# Patient Record
Sex: Male | Born: 1950
Health system: Southern US, Community
[De-identification: ages and names within clinical notes are randomized; demographics above are authoritative.]

## PROBLEM LIST (undated history)

## (undated) DIAGNOSIS — E7800 Pure hypercholesterolemia, unspecified: Secondary | ICD-10-CM

## (undated) DIAGNOSIS — H269 Unspecified cataract: Secondary | ICD-10-CM

## (undated) DIAGNOSIS — I10 Essential (primary) hypertension: Secondary | ICD-10-CM

## (undated) DIAGNOSIS — E079 Disorder of thyroid, unspecified: Secondary | ICD-10-CM

## (undated) DIAGNOSIS — T7840XA Allergy, unspecified, initial encounter: Secondary | ICD-10-CM

## (undated) DIAGNOSIS — D126 Benign neoplasm of colon, unspecified: Secondary | ICD-10-CM

## (undated) DIAGNOSIS — K219 Gastro-esophageal reflux disease without esophagitis: Secondary | ICD-10-CM

## (undated) DIAGNOSIS — R011 Cardiac murmur, unspecified: Secondary | ICD-10-CM

## (undated) DIAGNOSIS — M1990 Unspecified osteoarthritis, unspecified site: Secondary | ICD-10-CM

## (undated) DIAGNOSIS — G4730 Sleep apnea, unspecified: Secondary | ICD-10-CM

## (undated) HISTORY — DX: Essential (primary) hypertension: I10

## (undated) HISTORY — DX: Disorder of thyroid, unspecified: E07.9

## (undated) HISTORY — DX: Allergy, unspecified, initial encounter: T78.40XA

## (undated) HISTORY — PX: COLONOSCOPY: SHX174

## (undated) HISTORY — DX: Unspecified cataract: H26.9

## (undated) HISTORY — PX: TONSILLECTOMY: SUR1361

## (undated) HISTORY — DX: Cardiac murmur, unspecified: R01.1

## (undated) HISTORY — DX: Unspecified osteoarthritis, unspecified site: M19.90

---

## 2016-02-26 ENCOUNTER — Telehealth: Payer: Self-pay

## 2016-02-26 NOTE — Telephone Encounter (Signed)
[phone]  Patient received letter to schedule tcs

## 2016-03-09 ENCOUNTER — Telehealth: Payer: Self-pay

## 2016-03-09 NOTE — Telephone Encounter (Signed)
See separate triage.  

## 2016-03-18 DIAGNOSIS — D126 Benign neoplasm of colon, unspecified: Secondary | ICD-10-CM

## 2016-03-18 HISTORY — DX: Benign neoplasm of colon, unspecified: D12.6

## 2016-03-22 ENCOUNTER — Other Ambulatory Visit: Payer: Self-pay

## 2016-03-22 DIAGNOSIS — Z1211 Encounter for screening for malignant neoplasm of colon: Secondary | ICD-10-CM

## 2016-03-22 MED ORDER — PEG 3350-KCL-NA BICARB-NACL 420 G PO SOLR: 4000.0000 mL | ORAL | 0 refills | Status: DC

## 2016-03-22 NOTE — Telephone Encounter (Signed)
To [facility]inger to mail instructions.

## 2016-03-22 NOTE — Telephone Encounter (Signed)
NO PA is needed 

## 2016-03-22 NOTE — Telephone Encounter (Signed)
TRILYTE SPLIT DOSING, CLEAR LIQUIDS WITH BREAKFAST.

## 2016-03-22 NOTE — Telephone Encounter (Signed)
Rx called into [facility] and instructions are in the mail.

## 2016-03-22 NOTE — Telephone Encounter (Signed)
To Ginger

## 2016-03-22 NOTE — Telephone Encounter (Signed)
Gastroenterology Pre-Procedure Review  Request Date: 03/09/2016 Requesting Physician: Dr. Anastasio Champion  PATIENT REVIEW QUESTIONS: The patient responded to the following health history questions as indicated:    1. Diabetes Melitis: no 2. Joint replacements in the past 12 months: no 3. Major health problems in the past 3 months: no 4. Has an artificial valve or MVP: no 5. Has a defibrillator: no 6. Has been advised in past to take antibiotics in advance of a procedure like teeth cleaning: no 7. Family history of colon cancer: no  8. Alcohol Use: one beer daily 9. History of sleep apnea: Yes/ has C-pap 10. History of coronary artery or other vascular stents placed within the last 12 months: no    MEDICATIONS & ALLERGIES:    Patient reports the following regarding taking any blood thinners:   Plavix? no Aspirin? no Coumadin? no Brilinta? no Xarelto? no Eliquis? no Pradaxa? no Savaysa? no Effient? no  Patient confirms/reports the following medications:  Current Outpatient Prescriptions  Medication Sig Dispense Refill  . atorvastatin (LIPITOR) 40 MG tablet Take 40 mg by mouth daily.    . ranitidine (ZANTAC) 75 MG tablet Take 75 mg by mouth 2 (two) times daily.     No current facility-administered medications for this visit.     Patient confirms/reports the following allergies:  No Known Allergies  No orders of the defined types were placed in this encounter.   AUTHORIZATION INFORMATION Primary Insurance:  ID #:  Group #:  Pre-Cert / Auth required:  Pre-Cert / Auth #:   Secondary Insurance:   ID #:   Group #:  Pre-Cert / Auth required: Pre-Cert / Auth #:   SCHEDULE INFORMATION: Procedure has been scheduled as follows:  Date: 04/05/2016                Time: 8:30 AM Location: [facility] Short Stay  This Gastroenterology Pre-Precedure Review Form is being routed to the following provider(s): Barney Drain, MD

## 2016-04-05 ENCOUNTER — Encounter (HOSPITAL_COMMUNITY)
Admission: RE | Disposition: A | Discharge status: Home / Self Care | Payer: Self-pay | Source: Ambulatory Visit | Attending: Gastroenterology

## 2016-04-05 ENCOUNTER — Ambulatory Visit (HOSPITAL_COMMUNITY)
Admission: RE | Admit: 2016-04-05 | Discharge: 2016-04-05 | Disposition: A | Discharge status: Home / Self Care | Payer: Managed Care, Other (non HMO) | Source: Ambulatory Visit | Attending: Gastroenterology | Admitting: Gastroenterology

## 2016-04-05 ENCOUNTER — Encounter (HOSPITAL_COMMUNITY): Payer: Self-pay | Admitting: *Deleted

## 2016-04-05 DIAGNOSIS — Z87891 Personal history of nicotine dependence: Secondary | ICD-10-CM | POA: Diagnosis not present

## 2016-04-05 DIAGNOSIS — Z86010 Personal history of colonic polyps: Secondary | ICD-10-CM | POA: Insufficient documentation

## 2016-04-05 DIAGNOSIS — Z1211 Encounter for screening for malignant neoplasm of colon: Secondary | ICD-10-CM

## 2016-04-05 DIAGNOSIS — D122 Benign neoplasm of ascending colon: Secondary | ICD-10-CM | POA: Insufficient documentation

## 2016-04-05 DIAGNOSIS — D123 Benign neoplasm of transverse colon: Secondary | ICD-10-CM | POA: Diagnosis not present

## 2016-04-05 DIAGNOSIS — Z79899 Other long term (current) drug therapy: Secondary | ICD-10-CM | POA: Insufficient documentation

## 2016-04-05 DIAGNOSIS — G4730 Sleep apnea, unspecified: Secondary | ICD-10-CM | POA: Diagnosis not present

## 2016-04-05 DIAGNOSIS — K219 Gastro-esophageal reflux disease without esophagitis: Secondary | ICD-10-CM | POA: Insufficient documentation

## 2016-04-05 DIAGNOSIS — E7800 Pure hypercholesterolemia, unspecified: Secondary | ICD-10-CM | POA: Diagnosis not present

## 2016-04-05 DIAGNOSIS — K644 Residual hemorrhoidal skin tags: Secondary | ICD-10-CM | POA: Insufficient documentation

## 2016-04-05 DIAGNOSIS — Q438 Other specified congenital malformations of intestine: Secondary | ICD-10-CM | POA: Insufficient documentation

## 2016-04-05 DIAGNOSIS — K648 Other hemorrhoids: Secondary | ICD-10-CM | POA: Diagnosis not present

## 2016-04-05 HISTORY — DX: Pure hypercholesterolemia, unspecified: E78.00

## 2016-04-05 HISTORY — DX: Benign neoplasm of colon, unspecified: D12.6

## 2016-04-05 HISTORY — DX: Gastro-esophageal reflux disease without esophagitis: K21.9

## 2016-04-05 HISTORY — DX: Sleep apnea, unspecified: G47.30

## 2016-04-05 HISTORY — PX: COLONOSCOPY: SHX5424

## 2016-04-05 SURGERY — COLONOSCOPY
Anesthesia: Moderate Sedation

## 2016-04-05 MED ORDER — MIDAZOLAM HCL 5 MG/5ML IJ SOLN
INTRAMUSCULAR | Status: AC
  Filled 2016-04-05: qty 10

## 2016-04-05 MED ORDER — MEPERIDINE HCL 100 MG/ML IJ SOLN
INTRAMUSCULAR | Status: AC
  Filled 2016-04-05: qty 2

## 2016-04-05 MED ORDER — MIDAZOLAM HCL 5 MG/5ML IJ SOLN
INTRAMUSCULAR | Status: DC | PRN
  Administered 2016-04-05 (×2): 2 mg via INTRAVENOUS

## 2016-04-05 MED ORDER — STERILE WATER FOR IRRIGATION IR SOLN
Status: DC | PRN
  Administered 2016-04-05: 09:00:00

## 2016-04-05 MED ORDER — SODIUM CHLORIDE 0.9 % IV SOLN
INTRAVENOUS | Status: DC
  Administered 2016-04-05: 08:00:00 via INTRAVENOUS

## 2016-04-05 MED ORDER — MEPERIDINE HCL 100 MG/ML IJ SOLN
INTRAMUSCULAR | Status: DC | PRN
  Administered 2016-04-05: 25 mg
  Administered 2016-04-05: 50 mg

## 2016-04-05 NOTE — Discharge Instructions (Addendum)
Colon Polyps Polyps are tissue growths inside the body. Polyps can grow in many places, including the large intestine (colon). A polyp may be a round bump or a mushroom-shaped growth. You could have one polyp or several. Most colon polyps are noncancerous (benign). However, some colon polyps can become cancerous over time. What are the causes? The exact cause of colon polyps is not known. What increases the risk? This condition is more likely to develop in people who:  Have a family history of colon cancer or colon polyps.  Are older than 59 or older than 45 if they are African American.  Have inflammatory bowel disease, such as ulcerative colitis or Crohn disease.  Are overweight.  Smoke cigarettes.  Do not get enough exercise.  Drink too much alcohol.  Eat a diet that is:  High in fat and red meat.  Low in fiber.  Had childhood cancer that was treated with abdominal radiation. What are the signs or symptoms? Most polyps do not cause symptoms. If you have symptoms, they may include:  Blood coming from your rectum when having a bowel movement.  Blood in your stool.The stool may look dark red or black.  A change in bowel habits, such as constipation or diarrhea. How is this diagnosed? This condition is diagnosed with a colonoscopy. This is a procedure that uses a lighted, flexible scope to look at the inside of your colon. How is this treated? Treatment for this condition involves removing any polyps that are found. Those polyps will then be tested for cancer. If cancer is found, your health care provider will talk to you about options for colon cancer treatment. Follow these instructions at home: Diet   Eat plenty of fiber, such as fruits, vegetables, and whole grains.  Eat foods that are high in calcium and vitamin D, such as milk, cheese, yogurt, eggs, liver, fish, and broccoli.  Limit foods high in fat, red meats, and processed meats, such as hot dogs, sausage,  bacon, and lunch meats.  Maintain a healthy weight, or lose weight if recommended by your health care provider. General instructions   Do not smoke cigarettes.  Do not drink alcohol excessively.  Keep all follow-up visits as told by your health care provider. This is important. This includes keeping regularly scheduled colonoscopies. Talk to your health care provider about when you need a colonoscopy.  Exercise every day or as told by your health care provider. Contact a health care provider if:  You have new or worsening bleeding during a bowel movement.  You have new or increased blood in your stool.  You have a change in bowel habits.  You unexpectedly lose weight. This information is not intended to replace advice given to you by your health care provider. Make sure you discuss any questions you have with your health care provider. Document Released: 10/01/2003 Document Revised: 06/12/2015 Document Reviewed: 11/25/2014 Elsevier Interactive Patient Education  2017 [place]. High-Fiber Diet Fiber, also called dietary fiber, is a type of carbohydrate found in fruits, vegetables, whole grains, and beans. A high-fiber diet can have many health benefits. Your health care provider may recommend a high-fiber diet to help:  Prevent constipation. Fiber can make your bowel movements more regular.  Lower your cholesterol.  Relieve hemorrhoids, uncomplicated diverticulosis, or irritable bowel syndrome.  Prevent overeating as part of a weight-loss plan.  Prevent heart disease, type 2 diabetes, and certain cancers. What is my plan? The recommended daily intake of fiber includes:  38  grams for men under age 31.  51 grams for men over age 32.  63 grams for women under age 42.  32 grams for women over age 20. You can get the recommended daily intake of dietary fiber by eating a variety of fruits, vegetables, grains, and beans. Your health care provider may also recommend a fiber  supplement if it is not possible to get enough fiber through your diet. What do I need to know about a high-fiber diet?  Fiber supplements have not been widely studied for their effectiveness, so it is better to get fiber through food sources.  Always check the fiber content on thenutrition facts label of any prepackaged food. Look for foods that contain at least 5 grams of fiber per serving.  Ask your dietitian if you have questions about specific foods that are related to your condition, especially if those foods are not listed in the following section.  Increase your daily fiber consumption gradually. Increasing your intake of dietary fiber too quickly may cause bloating, cramping, or gas.  Drink plenty of water. Water helps you to digest fiber. What foods can I eat? Grains  Whole-grain breads. Multigrain cereal. Oats and oatmeal. Brown rice. Barley. Bulgur wheat. [place]. Bran muffins. Popcorn. Rye wafer crackers. Vegetables  Sweet potatoes. Spinach. Kale. Artichokes. Cabbage. Broccoli. Green peas. Carrots. Squash. Fruits  Berries. Pears. Apples. Oranges. Avocados. Prunes and raisins. Dried figs. Meats and Other Protein Sources  [place], kidney, pinto, and soy beans. Split peas. Lentils. Nuts and seeds. Dairy  Fiber-fortified yogurt. Beverages  Fiber-fortified soy milk. Fiber-fortified orange juice. Other  Fiber bars. The items listed above may not be a complete list of recommended foods or beverages. Contact your dietitian for more options.  What foods are not recommended? Grains  White bread. Pasta made with refined flour. White rice. Vegetables  Fried potatoes. Canned vegetables. Well-cooked vegetables. Fruits  Fruit juice. Cooked, strained fruit. Meats and Other Protein Sources  Fatty cuts of meat. Fried Sales executive or fried fish. Dairy  Milk. Yogurt. Cream cheese. Sour cream. Beverages  Soft drinks. Other  Cakes and pastries. Butter and oils. The items listed above may  not be a complete list of foods and beverages to avoid. Contact your dietitian for more information.  What are some tips for including high-fiber foods in my diet?  Eat a wide variety of high-fiber foods.  Make sure that half of all grains consumed each day are whole grains.  Replace breads and cereals made from refined flour or white flour with whole-grain breads and cereals.  Replace white rice with brown rice, bulgur wheat, or millet.  Start the day with a breakfast that is high in fiber, such as a cereal that contains at least 5 grams of fiber per serving.  Use beans in place of meat in soups, salads, or pasta.  Eat high-fiber snacks, such as berries, raw vegetables, nuts, or popcorn. This information is not intended to replace advice given to you by your health care provider. Make sure you discuss any questions you have with your health care provider. Document Released: 01/04/2005 Document Revised: 06/12/2015 Document Reviewed: 06/19/2013 Elsevier Interactive Patient Education  2017 [place]. Colonoscopy, Adult, Care After This sheet gives you information about how to care for yourself after your procedure. Your health care provider may also give you more specific instructions. If you have problems or questions, contact your health care provider. What can I expect after the procedure? After the procedure, it is common to have:  A small amount of blood in your stool for 24 hours after the procedure.  Some gas.  Mild abdominal cramping or bloating. Follow these instructions at home: General instructions    For the first 24 hours after the procedure:  Do not drive or use machinery.  Do not sign important documents.  Do not drink alcohol.  Do your regular daily activities at a slower pace than normal.  Eat soft, easy-to-digest foods.  Rest often.  Take over-the-counter or prescription medicines only as told by your health care provider.  It is up to you to get  the results of your procedure. Ask your health care provider, or the department performing the procedure, when your results will be ready. Relieving cramping and bloating   Try walking around when you have cramps or feel bloated.  Apply heat to your abdomen as told by your health care provider. Use a heat source that your health care provider recommends, such as a moist heat pack or a heating pad.  Place a towel between your skin and the heat source.  Leave the heat on for 20-30 minutes.  Remove the heat if your skin turns bright red. This is especially important if you are unable to feel pain, heat, or cold. You may have a greater risk of getting burned. Eating and drinking   Drink enough fluid to keep your urine clear or pale yellow.  Resume your normal diet as instructed by your health care provider. Avoid heavy or fried foods that are hard to digest.  Avoid drinking alcohol for as long as instructed by your health care provider. Contact a health care provider if:  You have blood in your stool 2-3 days after the procedure. Get help right away if:  You have more than a small spotting of blood in your stool.  You pass large blood clots in your stool.  Your abdomen is swollen.  You have nausea or vomiting.  You have a fever.  You have increasing abdominal pain that is not relieved with medicine. This information is not intended to replace advice given to you by your health care provider. Make sure you discuss any questions you have with your health care provider. Document Released: 08/19/2003 Document Revised: 09/29/2015 Document Reviewed: 03/18/2015 Elsevier Interactive Patient Education  2017 [place] have [place]. YOU HAD CINQUO Plipos en el colon REMOTO.   FOLLOW A Dieta con alto contenido de [place]. AVOID ITEMS THAT CAUSE BLOATING. SEE INFO BELOW.  YOUR BIOPSY RESULTS WILL BE AVAILABLE IN MY CHART AFTER  MAR 22 AND MY OFFICE WILL CONTACT YOU IN 10-14  DAYS WITH YOUR RESULTS.   Next Colonoscopa in 3 anos. YOUR SISTERS, BROTHERS, CHILDREN, AND PARENTS NEED TO HAVE A COLONOSCOPY STARTING AT THE AGE OF 40.    Colonoscopa: cuidados posteriores (Colonoscopy, Care After) Estas indicaciones le proporcionan informacin general acerca de cmo deber cuidarse despus del procedimiento. El mdico tambin podr darle instrucciones especficas. Comunquese con el mdico si tiene algn problema o tiene preguntas despus del procedimiento. CUIDADOS EN EL HOGAR  No conduzca durante 24horas.  No firme papeles importantes ni use maquinaria pesada durante 24horas.  Puede ducharse.  Puede retomar las actividades habituales, pero hgalo ms despacio durante las primeras 24horas.  Durante las primeras 24horas, descanse con frecuencia.  Camine o pngase compresas tibias en el vientre (abdomen) si tiene clicos intestinales o gases.  Beba suficiente lquido para mantener el pis (orina) claro o de color amarillo plido.  Retome su dieta  normal. No coma comidas pesadas ni fritas.  No tome alcohol durante 24horas, o segn el mdico le indique.  Tome solo los medicamentos segn le haya indicado el mdico. Si se obtuvo una muestra de tejido (biopsia) durante el procedimiento:   No tome aspirina ni anticoagulantes durante 7das, o segn el mdico le indique.  No tome alcohol durante 7das, o segn el mdico le indique.  Consuma alimentos livianos durante las primeras 24horas. SOLICITE AYUDA SI: An hay una pequea cantidad de sangre en la materia fecal (heces) 2 o 3das despus del procedimiento. SOLICITE AYUDA DE INMEDIATO SI:  Hay ms que una pequea cantidad de [employer] materia fecal.  Observa grumos de tejido (cogulos de [place]) en la materia fecal.  Tiene el vientre inflamado (hinchado).  Tiene malestar estomacal (nuseas) o vomita.  Tiene fiebre.  Siente que Conservation officer, historic buildings en el vientre empeora y no se alivia con los  medicamentos.   Dieta con alto contenido de fibra  (High [employer] Health Diet)  La fibra se encuentra en frutas, verduras y granos. Una dieta con alto contenido en fibras se favorece con la adicin de ms granos enteros, legumbres, frutas y verduras en su dieta. La cantidad recomendada de fibra para los hombres adultos es de 38 g por da. Para las mujeres adultas es de 25 g por da. Las [employer] y las que amamantan deben consumir 107 gramos de fibra por Training and development officer. Si usted tiene un problema digestivo o intestinal, consulte a su mdico antes de la adicin de alimentos ricos en fibra a su dieta. Coma una variedad de alimentos ricos en fibra en lugar de slo unos pocos.  OBJETIVO  Aumentar la masa fecal.  Tener deposiciones ms regulares para evitar el estreimiento.  Reducir el colesterol.  Para evitar comer en exceso. [place]?  En caso de estreimiento y hemorroides.  En caso de diverticulosis no complicada (enfermedad intestinal) y en el sndrome del colon irritable.  Si necesita ayuda para el control de [place].  Si desea mejorar su dieta como medida de proteccin contra la aterosclerosis, la diabetes y Science writer. Aristes y cereales integrales.  Frutas, como las [place], [place], pltanos, fresas, Development worker, community y peras.  Verduras, como guisantes, zanahorias, batatas, remolachas, brcoli, repollo, espinacas y alcauciles.  Legumbres, las arvejas, soja, lentejas.  Almendras. [place] DE LOS ALIMENTOS  Almidones y granos / Heritage manager (g)  Cheerios, 1 taza / 3 g  Corn Flakes, 1 taza / 0,7 g  Arroz inflado, 1  tazas / 0,3 g  Harina de avena instantnea (cocida),  taza / 2 g  Cereal de trigo escarchado, 1 taza / 5,1 g  Arroz marrn grano largo (cocido), 1 taza / 3,5 g  Arroz blanco grano largo (cocido), 1 taza / 0,6 g  Macarrones enriquecidos (cocidos), 1 taza / 2,5 g Legumbres / Fibra Diettica (g)  Frijoles cocidos (enlatados, crudos o  vegetarianos),  taza / 5,2 g  Frijoles (enlatados),  taza / 6,8 g  Frijoles pintos (cocidos),  taza / 5,5 g Panes y Administrator / Heritage manager (g)  Galletas de graham o miel, 2 plazas / 0,7 g  Galletitas saladas, 3 unidades / 0,3 g  Pretzels salados comunes, 10 pedazos / 1,8 g  Pan integral, 1 rebanada / 1,9 g  Pan blanco, 1 rebanada / 0,7 g  Pan con pasas, 1 rebanada / 1,2 g  Bagel 3 oz / 2 g  Tortilla de harina, 1  oz / 0.9 g  Tortilla de maz, 1 pequea / 1,5 g  Pan de amburguesa o hot dog, 1 pequeo / 0,9 g Frutas / Fibra Diettica (g)  Manzana con piel, 1 mediana / 4,4 g  Pur de [employer],  taza / 1,5 g  [place],  mediano / 1,5 g  Uvas, 10 uvas / 0,4 g  Naranja, 1 pequea / 2,3 g  Pasas, 1,5 oz / 1.6 g  Meln, 1 taza / 1,4 g Vegetales / Fibra Diettica (g)  Judas verdes (en conserva),  taza / 1,3 g  Zanahorias (cocido),  taza / 2,3 g  Broccoli (cocido),  taza / 2,8 g  Guisantes (cocidos),  taza / 4,4 g  Pur de papas,  taza / 1,6 g  Lechuga, 1 taza / 0,5 g  Maz (en lata),  taza / 1,6 g  Tomate,  taza / 1,1 g 1 cup / 3 g.  Plipos en el colon  (Colon Polyps)  Los plipos son masas de tejido que crecen dentro del cuerpo. Los plipos pueden desarrollarse en el intestino grueso (colon). La mayora de los plipos son no cancerosos (benignos). Sin embargo, algunos plipos pueden convertirse en cancerosos con el tiempo. Los plipos que sean ms grandes que un guisante pueden ser [employer]. Para estar seguros, los mdicos extirpan y [employer] plipos.  CAUSAS  Se forman cuando ciertas mutaciones genticas hacen que las clulas se desarrollen y se dividan por dems.  FACTORES DE RIESGO  Hay un nmero de factores de riesgo que pueden aumentar las probabilidades de padecer plipos en el colon. Ellos son:  Ser mayor de 73 aos de edad.  Historia familiar de cncer o plipos de colon.  Ciertas enfermedades crnicas como la colitis o la enfermedad de  Crohn.  Tener sobrepeso.  El hbito de fumar.  El sedentarismo.  Beber alcohol en exceso. SNTOMAS  La mayor parte de los plipos no causa sntomas. Si se presentan sntomas, stos pueden ser:  [employer] materia fecal. Heces de color rojo oscuro o negro.  Constipacin o diarrea que duran ms de 1 semana. DIAGNSTICO  [place] persona con frecuencia no sabe que tiene plipos [employer] su mdico los halla durante un examen fsico de [place]. El mdico puede usar 4 tipos de pruebas para Hydrographic surveyor plipos:  Examen Musician. Se colocar guantes y palpar el interior del recto. Esta prueba detectar solo plipos en el recto.  Enema de bario. El mdico introduce un lquido llamado bario en el recto y luego toma radiografas del colon. El bario hace que el colon se vea blanco. Los plipos son de color oscuro, por lo que son fciles de Chiropodist.  Sigmoideoscopia. Se coloca un tubo delgado y flexible (sigmoideoscopio) en el recto. Este sigmoideoscopio tiene [place] fuente de luz y Ardelia Mems pequea cmara de video. El mdico [place] el sigmoideoscopio para observar el ltimo tercio del colon.  Colonoscopa. Esta prueba es similar a la sigmoideoscopia, pero el mdico examina todo el colon. Este es el mtodo ms frecuente para Hydrographic surveyor y extirpar los plipos. TRATAMIENTO  Todo plipo ser extirpado Daneil Dan sigmoideoscopa o una colonoscopa. Luego se analizan para Heritage manager.  PREVENCIN  Para disminuir los riesgos de volver a Best boy plipos en el colon:  Coma mucha fruta y [place]. Evite las comidas grasas.  No fume.  Evite consumir alcohol.  [place].  Baje de peso segn las indicaciones de su mdico.  Consuma mucho  clcio y folato. Las comidas que contienen calcio son la [place], los quesos y el brcoli. Las comidas que contienen folato son los garbanzos, los frijoles rojos y Nurse, mental health.

## 2016-04-05 NOTE — Op Note (Signed)
[facility] Patient Name: Frank Noble Procedure Date: 04/05/2016 8:31 AM MRN: [id number] Date of Birth: 1950/05/09 Attending MD: Barney Drain , MD CSN: [id number] Age: 66 Admit Type: Outpatient Procedure:                Colonoscopy WITH COLD FORCEP/SNARE POLYPECTOMY Indications:              High risk colon cancer surveillance: Personal                            history of colonic polyps Providers:                Barney Drain, MD, Janeece Riggers, RN, Rosina Lowenstein, RN Referring MD:             Doree Albee MD, MD Medicines:                Meperidine 75 mg IV, Midazolam 4 mg IV Complications:            No immediate complications. Estimated Blood Loss:     Estimated blood loss was minimal. Procedure:                Pre-Anesthesia Assessment:                           - Prior to the procedure, a History and Physical                            was performed, and patient medications and                            allergies were reviewed. The patient's tolerance of                            previous anesthesia was also reviewed. The risks                            and benefits of the procedure and the sedation                            options and risks were discussed with the patient.                            All questions were answered, and informed consent                            was obtained. Prior Anticoagulants: The patient has                            taken no previous anticoagulant or antiplatelet                            agents. ASA Grade Assessment: II - A patient with                            mild systemic disease. After reviewing the risks  and benefits, the patient was deemed in                            satisfactory condition to undergo the procedure.                            After obtaining informed consent, the colonoscope                            was passed under direct vision. Throughout the                             procedure, the patient's blood pressure, pulse, and                            oxygen saturations were monitored continuously. The                            EC-[id number] ([id number]) scope was introduced through                            the anus and advanced to the the cecum, identified                            by appendiceal orifice and ileocecal valve. The                            colonoscopy was somewhat difficult due to a                            tortuous colon. Successful completion of the                            procedure was aided by COLOWRAP. The patient                            tolerated the procedure well. The quality of the                            bowel preparation was excellent. The ileocecal                            valve, appendiceal orifice, and rectum were                            photographed. Scope In: 9:07:36 AM Scope Out: 9:24:45 AM Scope Withdrawal Time: 0 hours 15 minutes 14 seconds  Total Procedure Duration: 0 hours 17 minutes 9 seconds  Findings:      Four sessile polyps were found in the hepatic flexure and ascending       colon. The polyps were 4 to 5 mm in size. These polyps were removed with       a cold snare. Resection and retrieval were complete.      A 2 mm polyp was found in  the hepatic flexure. The polyp was sessile.       The polyp was removed with a cold biopsy forceps. Resection and       retrieval were complete.      External and internal hemorrhoids were found during retroflexion. The       hemorrhoids were small. Impression:               - Four 4 to 5 mm polyps at the hepatic flexure(3)                            and in the ascending colon, removed with a cold                            snare. Resected and retrieved.                           - One 2 mm polyp at the hepatic flexure, removed                            with a cold biopsy forceps. Resected and retrieved.                           - External and internal  hemorrhoids. Moderate Sedation:      Moderate (conscious) sedation was administered by the endoscopy nurse       and supervised by the endoscopist. The following parameters were       monitored: oxygen saturation, heart rate, blood pressure, and response       to care. Total physician intraservice time was 26 minutes. Recommendation:           - Repeat colonoscopy in 3 years for surveillance.                           - High fiber diet.                           - Continue present medications.                           - Await pathology results.                           - Patient has a contact number available for                            emergencies. The signs and symptoms of potential                            delayed complications were discussed with the                            patient. Return to normal activities tomorrow.                            Written discharge instructions were provided to the  patient. Procedure Code(s):        --- Professional ---                           [phone], Colonoscopy, flexible; with removal of                            tumor(s), polyp(s), or other lesion(s) by snare                            technique                           45380, 59, Colonoscopy, flexible; with biopsy,                            single or multiple                           99152, Moderate sedation services provided by the                            same physician or other qualified health care                            professional performing the diagnostic or                            therapeutic service that the sedation supports,                            requiring the presence of an independent trained                            observer to assist in the monitoring of the                            patient's level of consciousness and physiological                            status; initial 15 minutes of intraservice time,                             patient age 34 years or older                           [phone], Moderate sedation services; each additional                            15 minutes intraservice time Diagnosis Code(s):        --- Professional ---                           Z86.010, Personal history of colonic polyps  D12.3, Benign neoplasm of transverse colon (hepatic                            flexure or splenic flexure)                           D12.2, Benign neoplasm of ascending colon                           K64.8, Other hemorrhoids CPT copyright 2016 [employer]. All rights reserved. The codes documented in this report are preliminary and upon coder review may  be revised to meet current compliance requirements. Barney Drain, MD Barney Drain, MD 04/05/2016 9:56:58 AM This report has been signed electronically. Number of Addenda: 0

## 2016-04-05 NOTE — OR Nursing (Signed)
Patient wore CPAP enabled CO 2 monitor

## 2016-04-05 NOTE — H&P (Signed)
Primary Care Physician:  Doree Albee, MD Primary Gastroenterologist:  Dr. Oneida Alar  Pre-Procedure History & Physical: HPI:  Frank Noble is a 66 y.o. male here for COLON CANCER SCREENING.  Past Medical History:  Diagnosis Date  . GERD (gastroesophageal reflux disease)   . Hypercholesteremia   . Sleep apnea     Past Surgical History:  Procedure Laterality Date  . COLONOSCOPY    . TONSILLECTOMY      Prior to Admission medications   Medication Sig Start Date End Date Taking? Authorizing Provider  atorvastatin (LIPITOR) 40 MG tablet Take 40 mg by mouth daily.   Yes Historical Provider, MD  Cholecalciferol (VITAMIN D-3) 5000 units TABS Take 5,000 Units by mouth daily.   Yes Historical Provider, MD  Multiple Vitamin (MULTIVITAMIN WITH MINERALS) TABS tablet Take 1 tablet by mouth daily.   Yes Historical Provider, MD  polyethylene glycol-electrolytes (TRILYTE) 420 g solution Take 4,000 mLs by mouth as directed. 03/22/16  Yes [name], MD  ranitidine (ZANTAC) 150 MG tablet Take 150 mg by mouth at bedtime.   Yes Historical Provider, MD  testosterone cypionate (DEPOTESTOSTERONE CYPIONATE) 200 MG/ML injection Inject 80 mg into the muscle every 7 (seven) days. Injects 0.22ml on Sundays. 02/15/16  Yes Historical Provider, MD  vitamin C (ASCORBIC ACID) 500 MG tablet Take 500 mg by mouth daily as needed (Takes when he has cold symptoms).   Yes Historical Provider, MD    Allergies as of 03/22/2016  . (No Known Allergies)    Family History  Problem Relation Age of Onset  . Colon cancer Neg Hx     Social History   Social History  . Marital status: Married    Spouse name: N/A  . Number of children: N/A  . Years of education: N/A   Occupational History  . Not on file.   Social History Main Topics  . Smoking status: Former Smoker    Packs/day: 0.50    Years: 10.00    Types: Cigarettes  . Smokeless tobacco: Never Used  . Alcohol use 4.2 oz/week    7 Shots of liquor per week   . Drug use: No  . Sexual activity: Not on file   Other Topics Concern  . Not on file   Social History Narrative  . No narrative on file    Review of Systems: See HPI, otherwise negative ROS   Physical Exam: BP 138/84   Pulse 65   Temp 97.7 F (36.5 C) (Oral)   Resp 19   Ht 5\' 9"  (1.753 m)   Wt 207 lb (93.9 kg)   SpO2 95%   BMI 30.57 kg/m  General:   Alert,  pleasant and cooperative in NAD Head:  Normocephalic and atraumatic. Neck:  Supple; Lungs:  Clear throughout to auscultation.    Heart:  Regular rate and rhythm. Abdomen:  Soft, nontender and nondistended. Normal bowel sounds, without guarding, and without rebound.   Neurologic:  Alert and  oriented x4;  grossly normal neurologically.  Impression/Plan:     SCREENING  Plan:  1. TCS TODAY. DISCUSSED PROCEDURE, BENEFITS, & RISKS: < 1% chance of medication reaction, bleeding, perforation, or rupture of spleen/liver.

## 2016-04-06 ENCOUNTER — Encounter (HOSPITAL_COMMUNITY): Payer: Self-pay | Admitting: Gastroenterology

## 2016-04-26 ENCOUNTER — Encounter (HOSPITAL_COMMUNITY): Payer: Self-pay | Admitting: Gastroenterology

## 2016-07-14 ENCOUNTER — Ambulatory Visit (INDEPENDENT_AMBULATORY_CARE_PROVIDER_SITE_OTHER): Payer: Managed Care, Other (non HMO) | Admitting: Neurology

## 2016-07-14 ENCOUNTER — Encounter: Payer: Self-pay | Admitting: Neurology

## 2016-07-14 VITALS — BP 145/92 | HR 92 | Ht 69.0 in | Wt 218.6 lb

## 2016-07-14 DIAGNOSIS — R200 Anesthesia of skin: Secondary | ICD-10-CM

## 2016-07-14 NOTE — Patient Instructions (Signed)
As far as diagnostic testing: emg/ncs  Our phone number is [phone]. We also have an after hours call service for urgent matters and there is a physician on-call for urgent questions. For any emergencies you know to call 911 or go to the nearest emergency room  Your physician has ordered a Nerve Conduction Study (NCV) and/or EMG testing.  This is a test to assess the status of your nerves and muscles. For the NCV portion of the test , sticky tabs will be placed on either your hands or feet.  Your nerves will be stimulated using small electrical charges and the speed of the impulse will be measured as it travels down the nerve. For the EMG portion of the test, a small pin will be placed below the surface of the skin to measure the electrical activity in certain muscles in your arms or legs. Eating/drinking prior to testing is ok.  Please DO NOT discontinue ANY medications prior to testing  Your appointment will be scheduled by a member of our team.  You will need to check in with the main reception desk. Your test could take approximately 30 minutes to 1 hour to complete depending on the extent of the test that has been ordered. TESTING ON LEGS/BACK/HIP/FOOT AREA: Bring/wear a pair of shorts.  **ABSOLUTELY NO LOTIONS, MOISTURIZERS, VASELINE, OILS OR CREAMS OF ANY KIND ON ANY BODY PART THE DAY OF THE TEST**  **DEODORANT IS OK TO WEAR**  Please make every effort to keep your scheduled appointment time, as your physician needs the test results prior to your next appointment.  If you have to cancel or reschedule, please do so as soon as possible, so that another patient may use that testing time slot.  If you cancel your appointment, you may also need to reschedule your referring doctor's appointment until the test and results can be completed.  Please call [phone] and ask for Dr. Romona Curls assistant if you need to do so.  If you have any questions at any time please let us know.  We look  forward to working with you!!

## 2016-07-14 NOTE — Progress Notes (Signed)
[facility]    Provider:  Dr Jaynee Eagles Referring Provider: Doree Albee, MD Primary Care Physician:  Doree Albee, MD  CC:  Right hand numbness  HPI:  Frank Noble is a 66 y.o. male here as a referral from Dr. Anastasio Champion for right hand numbness. Past medical history of hypogonadism, vitamin D deficiency, GERD. Symptoms in the hand started many years ago, 20+ years ago. 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 A few months ago he started getting pain around the elbow. The elbow pain went away but a month later everytime he reaches back he has oins and needles and pain in the dorsal aspect of the 1st interspace and now it is numb. No weakness. Always numb now. He has a history of pain in the neck area when he was carrying one of his kids. He has low back pain. Not dropping things, no clumsiness of the right hand. He types and uses tools and no grip, hand or arm weakness.   Reviewed notes, labs and imaging from outside physicians, which showed:   Reviewed primary care notes. Patient describes right hand numbness between the thumb and index finger on the dorsal aspect. This has imprisoned for 1 month. In the setting of gained weight. He is not compliant with his vitamin D therapy. He's never been a smoker. He drinks 1 beer daily. No illicit drug use. 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 Blood pressure is elevated. Examination of his right hand does not show any obvious sensory disturbance. He has pitting edema more in the left than the right in the lower extremities but no clinical evidence of heart failure. He also has BPH.  Review of Systems: Patient complains of symptoms per HPI as well as the following symptoms: leg swelling. Pertinent negatives and positives per HPI. All others negative.   Social History   Social History  . Marital status: Married    Spouse name: N/A  . Number of children: N/A    . Years of education: N/A   Occupational History  . Not on file.   Social History Main Topics  . Smoking status: Former Smoker    Packs/day: 0.50    Years: 10.00    Types: Cigarettes  . Smokeless tobacco: Never Used  . Alcohol use 4.2 oz/week    7 Shots of liquor per week  . Drug use: No  . Sexual activity: Not on file   Other Topics Concern  . Not on file   Social History Narrative  . No narrative on file    Family History  Problem Relation Age of Onset  . Colon cancer Neg Hx     Past Medical History:  Diagnosis Date  . Colon adenomas 03/2016   FIVE  . GERD (gastroesophageal reflux disease)   . Hypercholesteremia   . Sleep apnea     Past Surgical History:  Procedure Laterality Date  . COLONOSCOPY    . COLONOSCOPY N/A 04/05/2016   Procedure: COLONOSCOPY;  Surgeon: [name], MD;  Location: [facility];  Service: Endoscopy;  Laterality: N/A;  8:30 Am  . TONSILLECTOMY      Current Outpatient Prescriptions  Medication Sig Dispense Refill  . atorvastatin (LIPITOR) 40 MG tablet Take 40 mg by mouth daily.    . Cholecalciferol (VITAMIN D-3) 5000 units TABS Take 5,000 Units by mouth daily.    . Multiple Vitamin (MULTIVITAMIN WITH MINERALS) TABS  tablet Take 1 tablet by mouth daily.    . ranitidine (ZANTAC) 150 MG tablet Take 150 mg by mouth at bedtime.    Marland Kitchen testosterone cypionate (DEPOTESTOSTERONE CYPIONATE) 200 MG/ML injection Inject 80 mg into the muscle every 7 (seven) days. Injects 0.6ml on Sundays.  0  . vitamin C (ASCORBIC ACID) 500 MG tablet Take 500 mg by mouth daily as needed (Takes when he has cold symptoms).     No current facility-administered medications for this visit.     Allergies as of 07/14/2016  . (No Known Allergies)    Vitals: Ht 5\' 9"  (1.753 m)   Wt 218 lb 9.6 oz (99.2 kg)   BMI 32.28 kg/m  Last Weight:  Wt Readings from Last 1 Encounters:  07/14/16 218 lb 9.6 oz (99.2 kg)   Last Height:   Ht Readings from Last 1 Encounters:   07/14/16 5\' 9"  (1.753 m)     Physical exam: Exam: Gen: NAD, conversant, well nourised, obese, well groomed                     CV: RRR, no MRG. No Carotid Bruits. No peripheral edema, warm, nontender Eyes: Conjunctivae clear without exudates or hemorrhage  Neuro: Detailed Neurologic Exam  Speech:    Speech is normal; fluent and spontaneous with normal comprehension.  Cognition:    The patient is oriented to person, place, and time;     recent and remote memory intact;     language fluent;     normal attention, concentration,     fund of knowledge Cranial Nerves:    The pupils are equal, round, and reactive to light. The fundi are normal and spontaneous venous pulsations are present. Visual fields are full to finger confrontation. Extraocular movements are intact. Trigeminal sensation is intact and the muscles of mastication are normal. The face is symmetric. The palate elevates in the midline. Hearing intact. Voice is normal. Shoulder shrug is normal. The tongue has normal motion without fasciculations.   Coordination:    Normal finger to nose and heel to shin. Normal rapid alternating movements.   Gait:    Heel-toe and tandem gait are normal.   Motor Observation:    No asymmetry, no atrophy, and no involuntary movements noted. Tone:    Normal muscle tone.    Posture:    Posture is normal. normal erect    Strength:4+- 5- right triceps. Otherwise strength is V/V in the upper and lower limbs.      Sensation: Decreased right hand and forearm in a radial nerve distribution.     Reflex Exam:  DTR's:    Deep tendon reflexes in the upper and lower extremities are normal bilaterally.   Toes:    The toes are downgoing bilaterally.   Clonus:    Clonus is absent.     Assessment/Plan:  66 year old patient with right hand and forearm numbness in the radial nerve distribution as well as mild right triceps weakness need EMG nerve conduction study including radial sensory and  radial motor to evaluate for radial palsy versus C7 radiculopathy.   Sarina Ill, MD  [facility] [address] [place], [place] [address]  Phone [phone] Fax [phone]

## 2016-07-29 ENCOUNTER — Ambulatory Visit: Payer: Managed Care, Other (non HMO) | Admitting: Neurology

## 2016-08-05 ENCOUNTER — Ambulatory Visit (INDEPENDENT_AMBULATORY_CARE_PROVIDER_SITE_OTHER): Payer: Self-pay | Admitting: Neurology

## 2016-08-05 ENCOUNTER — Ambulatory Visit (INDEPENDENT_AMBULATORY_CARE_PROVIDER_SITE_OTHER): Payer: Managed Care, Other (non HMO) | Admitting: Neurology

## 2016-08-05 DIAGNOSIS — Z0289 Encounter for other administrative examinations: Secondary | ICD-10-CM

## 2016-08-05 DIAGNOSIS — R200 Anesthesia of skin: Secondary | ICD-10-CM | POA: Diagnosis not present

## 2016-08-05 DIAGNOSIS — G5631 Lesion of radial nerve, right upper limb: Secondary | ICD-10-CM

## 2016-08-05 NOTE — Procedures (Signed)
Full Name: Frank Noble Gender: Male MRN #: [id number] Date of Birth: 1950/07/15    Visit Date: 08/05/2016 08:18 Age: 66 Years 2 Months Old Examining Physician: Sarina Ill, MD  Referring Physician: Hurshel Party, MD  History: 66 year old patient with right hand and forearm numbness in the radial nerve distribution as well as mild right triceps weakness need EMG nerve conduction study including radial sensory and radial motor to evaluate for radial palsy versus C7 radiculopathy.  Summary: The right radial sensory nerve showed a decreased amplitude (5 V, N>15).  Right radial motor nerve showed technically normal amplitude (3.1 mV, N>2) however there was a significant asymmetry with the right radial motor nerve whose amplitude was 4.6 V. All remaining nerves were normal as detailed below. All muscles were normal as detailed below.    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 EMG did not show acute or ongoing denervation or chronic neurogenic changes in any radial muscles. No suggestion of cervical radiculopathy.  Sarina Ill M.D.  [facility] [place], [place] [address] Tel: [phone] Fax: [phone]        [facility]    Nerve / Sites Rec. Site Latency Ref. Amplitude Ref. Rel Amp Segments Distance Velocity Ref. Area    ms ms mV mV %  cm m/s m/s mVms  R Median - APB     Wrist APB 3.1 ?4.4 8.0 ?4.0 100 Wrist - APB 7   26.0     Upper arm APB 6.9  8.0  99.8 Upper arm - Wrist 21 54 ?49 27.6  R Ulnar - ADM     Wrist ADM 2.0 ?3.3 9.3 ?6.0 100 Wrist - ADM 7   32.7     B.Elbow ADM 5.8  8.7  94.1 B.Elbow - Wrist 19 50 ?49 30.2     A.Elbow ADM 7.7  8.3  95.3 A.Elbow - B.Elbow 10 55 ?49 28.7         A.Elbow - Wrist      R Radial - EIP     Forearm EIP 1.5 ?2.9 3.1 ?2.0 100 Forearm - EIP 4  ?49 14.3     Elbow EIP 4.6  2.9  92.5 Elbow - Forearm 18 58  16.3     Spiral Gr EIP 5.9  3.1  105 Spiral Gr - Elbow 8  61  17.1     Axilla EIP 7.0  2.9  93.8 Axilla - Spiral Gr 7 61  9.4  L Radial - EIP     Forearm EIP 1.5 ?2.9 4.6 ?2.0 100 Forearm - EIP 4  ?49 19.8     Elbow EIP 4.7  4.0  88 Elbow - Forearm 18 56  19.5     Spiral Gr EIP 6.2  3.8  95.2 Spiral Gr - Elbow 8 53  18.8     Axilla EIP 7.1  3.6  94.5 Axilla - Spiral Gr 6 64  10.8             SNC    Nerve / Sites Rec. Site Peak Lat Ref.  Amp Ref. Segments Distance    ms ms V V  cm  R Radial - Anatomical snuff box (Forearm)     Forearm Wrist 2.03 ?2.90 5 ?15 Forearm - Wrist 10  L Radial - Anatomical snuff box (Forearm)     Forearm Wrist 2.19 ?2.90 34 ?15 Forearm - Wrist 10  R Median - Orthodromic (Dig  II, Mid palm)     Dig II Wrist 2.66 ?3.40 24 ?10 Dig II - Wrist 13  R Ulnar - Orthodromic, (Dig V, Mid palm)     Dig V Wrist 2.45 ?3.10 12 ?5 Dig V - Wrist 38             F  Wave    Nerve F Lat Ref.   ms ms  R Ulnar - ADM 27.5 ?32.0       EMG full       EMG Summary Table    Spontaneous MUAP Recruitment  Muscle IA Fib PSW Fasc Other Amp Dur. Poly Pattern  R. Brachioradialis Normal None None None _______ Normal Normal Normal Normal  R. Cervical paraspinals (low) Normal None None None _______ Normal Normal Normal Normal  R. Deltoid Normal None None None _______ Normal Normal Normal Normal  R. Triceps brachii Normal None None None _______ Normal Normal Normal Normal  R. Brachialis Normal None None None _______ Normal Normal Normal Normal  R. Extensor digitorum communis Normal None None None _______ Normal Normal Normal Normal  R. Extensor indicis proprius Normal None None None _______ Normal Normal Normal Normal  R. Pronator teres Normal None None None _______ Normal Normal Normal Normal  R. Flexor Pollicis Longus Normal None None None _______ Normal Normal Normal Normal  R. Opponens pollicis Normal None None None _______ Normal Normal Normal Normal

## 2016-08-05 NOTE — Progress Notes (Signed)
Full Name: Axyl Sitzman Gender: Male MRN #: [id number] Date of Birth: 1950/05/01    Visit Date: 08/05/2016 08:18 Age: 66 Years 2 Months Old Examining Physician: Sarina Ill, MD  Referring Physician: Hurshel Party, MD  History: 66 year old patient with right hand and forearm numbness in the radial nerve distribution as well as mild right triceps weakness need EMG nerve conduction study including radial sensory and radial motor to evaluate for radial palsy versus C7 radiculopathy.  Summary: The right radial sensory nerve showed a decreased amplitude (5 V, N>15).  Right radial motor nerve showed technically normal amplitude (3.1 mV, N>2) however there was a significant asymmetry with the right radial motor nerve whose amplitude was 4.6 V. All remaining nerves were normal as detailed below. All muscles were normal as detailed below.    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 EMG did not show acute or ongoing denervation or chronic neurogenic changes in any radial muscles. No suggestion of cervical radiculopathy.  Sarina Ill M.D.  [facility] [place], [place] [address] Tel: [phone] Fax: [phone]        [facility]    Nerve / Sites Rec. Site Latency Ref. Amplitude Ref. Rel Amp Segments Distance Velocity Ref. Area    ms ms mV mV %  cm m/s m/s mVms  R Median - APB     Wrist APB 3.1 ?4.4 8.0 ?4.0 100 Wrist - APB 7   26.0     Upper arm APB 6.9  8.0  99.8 Upper arm - Wrist 21 54 ?49 27.6  R Ulnar - ADM     Wrist ADM 2.0 ?3.3 9.3 ?6.0 100 Wrist - ADM 7   32.7     B.Elbow ADM 5.8  8.7  94.1 B.Elbow - Wrist 19 50 ?49 30.2     A.Elbow ADM 7.7  8.3  95.3 A.Elbow - B.Elbow 10 55 ?49 28.7         A.Elbow - Wrist      R Radial - EIP     Forearm EIP 1.5 ?2.9 3.1 ?2.0 100 Forearm - EIP 4  ?49 14.3     Elbow EIP 4.6  2.9  92.5 Elbow - Forearm 18 58  16.3     Spiral Gr EIP 5.9  3.1  105 Spiral Gr - Elbow 8  61  17.1     Axilla EIP 7.0  2.9  93.8 Axilla - Spiral Gr 7 61  9.4  L Radial - EIP     Forearm EIP 1.5 ?2.9 4.6 ?2.0 100 Forearm - EIP 4  ?49 19.8     Elbow EIP 4.7  4.0  88 Elbow - Forearm 18 56  19.5     Spiral Gr EIP 6.2  3.8  95.2 Spiral Gr - Elbow 8 53  18.8     Axilla EIP 7.1  3.6  94.5 Axilla - Spiral Gr 6 64  10.8             SNC    Nerve / Sites Rec. Site Peak Lat Ref.  Amp Ref. Segments Distance    ms ms V V  cm  R Radial - Anatomical snuff box (Forearm)     Forearm Wrist 2.03 ?2.90 5 ?15 Forearm - Wrist 10  L Radial - Anatomical snuff box (Forearm)     Forearm Wrist 2.19 ?2.90 34 ?15 Forearm - Wrist 10  R Median - Orthodromic (Dig  II, Mid palm)     Dig II Wrist 2.66 ?3.40 24 ?10 Dig II - Wrist 13  R Ulnar - Orthodromic, (Dig V, Mid palm)     Dig V Wrist 2.45 ?3.10 12 ?5 Dig V - Wrist 96             F  Wave    Nerve F Lat Ref.   ms ms  R Ulnar - ADM 27.5 ?32.0       EMG full       EMG Summary Table    Spontaneous MUAP Recruitment  Muscle IA Fib PSW Fasc Other Amp Dur. Poly Pattern  R. Brachioradialis Normal None None None _______ Normal Normal Normal Normal  R. Cervical paraspinals (low) Normal None None None _______ Normal Normal Normal Normal  R. Deltoid Normal None None None _______ Normal Normal Normal Normal  R. Triceps brachii Normal None None None _______ Normal Normal Normal Normal  R. Brachialis Normal None None None _______ Normal Normal Normal Normal  R. Extensor digitorum communis Normal None None None _______ Normal Normal Normal Normal  R. Extensor indicis proprius Normal None None None _______ Normal Normal Normal Normal  R. Pronator teres Normal None None None _______ Normal Normal Normal Normal  R. Flexor Pollicis Longus Normal None None None _______ Normal Normal Normal Normal  R. Opponens pollicis Normal None None None _______ Normal Normal Normal Normal

## 2016-08-05 NOTE — Progress Notes (Signed)
See procedure note.

## 2018-10-17 ENCOUNTER — Other Ambulatory Visit (INDEPENDENT_AMBULATORY_CARE_PROVIDER_SITE_OTHER): Payer: Self-pay | Admitting: Internal Medicine

## 2018-10-17 ENCOUNTER — Telehealth (INDEPENDENT_AMBULATORY_CARE_PROVIDER_SITE_OTHER): Payer: Self-pay

## 2018-10-17 DIAGNOSIS — N529 Male erectile dysfunction, unspecified: Secondary | ICD-10-CM

## 2018-10-17 MED ORDER — EASY GLIDE LUER LOCK SYRINGE 1 ML MISC: 1.0000 | 0 refills | Status: DC

## 2018-10-18 ENCOUNTER — Other Ambulatory Visit (INDEPENDENT_AMBULATORY_CARE_PROVIDER_SITE_OTHER): Payer: Self-pay | Admitting: Internal Medicine

## 2018-10-18 MED ORDER — NEEDLES & SYRINGES MISC: 1.0000 mm | 0 refills | Status: DC

## 2018-10-18 NOTE — Telephone Encounter (Signed)
Completed by Dr Anastasio Champion

## 2018-10-20 ENCOUNTER — Other Ambulatory Visit: Payer: Self-pay

## 2018-10-20 DIAGNOSIS — Z20822 Contact with and (suspected) exposure to covid-19: Secondary | ICD-10-CM

## 2018-10-21 LAB — NOVEL CORONAVIRUS, NAA: SARS-CoV-2, NAA: NOT DETECTED

## 2018-10-26 ENCOUNTER — Telehealth (INDEPENDENT_AMBULATORY_CARE_PROVIDER_SITE_OTHER): Payer: Self-pay | Admitting: Internal Medicine

## 2018-10-26 ENCOUNTER — Other Ambulatory Visit (INDEPENDENT_AMBULATORY_CARE_PROVIDER_SITE_OTHER): Payer: Self-pay | Admitting: Internal Medicine

## 2018-10-26 MED ORDER — TESTOSTERONE CYPIONATE 200 MG/ML IM SOLN: 80.0000 mg | INTRAMUSCULAR | 0 refills | Status: DC

## 2018-10-26 NOTE — Telephone Encounter (Signed)
done

## 2018-11-28 ENCOUNTER — Encounter (INDEPENDENT_AMBULATORY_CARE_PROVIDER_SITE_OTHER): Payer: Self-pay | Admitting: Internal Medicine

## 2018-11-28 ENCOUNTER — Other Ambulatory Visit: Payer: Self-pay

## 2018-11-28 ENCOUNTER — Ambulatory Visit (INDEPENDENT_AMBULATORY_CARE_PROVIDER_SITE_OTHER): Payer: Managed Care, Other (non HMO) | Admitting: Internal Medicine

## 2018-11-28 VITALS — BP 130/76 | HR 67 | Temp 97.3°F | Ht 69.0 in | Wt 210.8 lb

## 2018-11-28 DIAGNOSIS — E782 Mixed hyperlipidemia: Secondary | ICD-10-CM | POA: Diagnosis not present

## 2018-11-28 DIAGNOSIS — N401 Enlarged prostate with lower urinary tract symptoms: Secondary | ICD-10-CM | POA: Diagnosis not present

## 2018-11-28 DIAGNOSIS — G8929 Other chronic pain: Secondary | ICD-10-CM

## 2018-11-28 DIAGNOSIS — R3914 Feeling of incomplete bladder emptying: Secondary | ICD-10-CM

## 2018-11-28 DIAGNOSIS — E559 Vitamin D deficiency, unspecified: Secondary | ICD-10-CM

## 2018-11-28 DIAGNOSIS — K219 Gastro-esophageal reflux disease without esophagitis: Secondary | ICD-10-CM

## 2018-11-28 DIAGNOSIS — E785 Hyperlipidemia, unspecified: Secondary | ICD-10-CM

## 2018-11-28 DIAGNOSIS — E291 Testicular hypofunction: Secondary | ICD-10-CM

## 2018-11-28 DIAGNOSIS — Z1159 Encounter for screening for other viral diseases: Secondary | ICD-10-CM

## 2018-11-28 DIAGNOSIS — N400 Enlarged prostate without lower urinary tract symptoms: Secondary | ICD-10-CM

## 2018-11-28 DIAGNOSIS — M545 Low back pain, unspecified: Secondary | ICD-10-CM

## 2018-11-28 HISTORY — DX: Hyperlipidemia, unspecified: E78.5

## 2018-11-28 HISTORY — DX: Testicular hypofunction: E29.1

## 2018-11-28 HISTORY — DX: Benign prostatic hyperplasia without lower urinary tract symptoms: N40.0

## 2018-11-28 HISTORY — DX: Vitamin D deficiency, unspecified: E55.9

## 2018-11-28 NOTE — Progress Notes (Signed)
Metrics: Intervention Frequency ACO  Documented Smoking Status Yearly  Screened one or more times in 24 months  Cessation Counseling or  Active cessation medication Past 24 months  Past 24 months   Guideline developer: UpToDate (See UpToDate for funding source) Date Released: 2014       Wellness Office Visit  Subjective:  Patient ID: Frank Noble, male    DOB: 18-Apr-1950  Age: 68 y.o. MRN: [id number]  CC: This man comes in for follow-up of hyperlipidemia, BPH, vitamin D deficiency and hypogonadism. HPI  Overall, he has done well and is managing to lose further weight, albeit slowly. He did increase vitamin D3 to 10,000 units daily as his levels were still low on the last visit. He continues to take testosterone therapy and feels better with it. He also continues with Flomax for his BPH which seems to have helped his symptoms. He is concerned about chronic low back pain which is midline and has been present for several months.  The pain does not radiate down his legs but when he does get the pain, he has to take ibuprofen for several days. Past Medical History:  Diagnosis Date  . BPH (benign prostatic hyperplasia) 11/28/2018  . Colon adenomas 03/2016   5, polyps  . GERD (gastroesophageal reflux disease)   . HLD (hyperlipidemia) 11/28/2018  . Hypercholesteremia   . 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 Sleep apnea   . Vitamin D deficiency disease 11/28/2018      Family History  Problem Relation Age of Onset  . Breast cancer Mother        Mets to liver  . High blood pressure Father   . Aneurysm Father   . 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   Social History   Tobacco Use  . Smoking status: Former Smoker    Packs/day: 0.50    Years: 10.00    Pack years: 5.00    Types: Cigarettes  . Smokeless tobacco: Never Used  .  Tobacco comment: Quit age 79yr  Substance Use Topics  . Alcohol use: Yes    Alcohol/week: 7.0 standard drinks    Types: 7 Cans of beer per week    Current Meds  Medication Sig  . Cholecalciferol (VITAMIN D-3) 5000 units TABS Take 10,000 Units by mouth daily.   . Needles & Syringes MISC 1 mm by Does not apply route as directed.  . Omeprazole 20 MG TBDD Take 20 mg by mouth daily.  . Syringe, Disposable, (EASY GLIDE LUER LOCK SYRINGE) 1 ML MISC 1 Syringe by Does not apply route once a week.  . tadalafil (CIALIS) 5 MG tablet TAKE 1 TABLET DAILY  . testosterone cypionate (DEPOTESTOSTERONE CYPIONATE) 200 MG/ML injection Inject 0.4 mLs (80 mg total) into the muscle every 7 (seven) days. Injects 0.452mon Sundays. (Patient taking differently: Inject 100 mg into the muscle every 7 (seven) days. Injects 0.34m834mn Sundays.)  . thyroid (ARMOUR) 60 MG tablet Take 60 mg by mouth daily before breakfast.  . [DISCONTINUED] Multiple Vitamin (MULTIVITAMIN WITH MINERALS) TABS tablet Take 1 tablet by mouth daily.     Bio Identical Hormones  Testosterone therapy is being used off label for symptoms of testosterone deficiency and benefits that it produces based on several studies.  These benefits include decreasing body fat, increasing in lean muscle mass and  increasing in bone density.  There is improvement of memory, cognition.  There is improvement in exercise tolerance and endurance.  Testosterone therapy has also been shown to be protective against coronary artery disease, cerebrovascular disease, diabetes, hypertension and degenerative joint disease. I have discussed with the patient the FDA warnings regarding testosterone therapy, benefits and side effects and modes of administration as well as monitoring blood levels and side effects  on a regular basis The patient is agreeable that testosterone therapy should be an integral part of his/her wellness,quality of life and prevention of chronic disease.   Objective:   Today's Vitals: BP 130/76 (BP Location: Left Arm, Patient Position: Sitting, Cuff Size: Normal)   Pulse 67   Temp (!) 97.3 F (36.3 C) (Temporal)   Ht 5' 9" (1.753 m)   Wt 210 lb 12.8 oz (95.6 kg)   SpO2 98%   BMI 31.13 kg/m  Vitals with BMI 11/28/2018 07/14/2016 04/05/2016  Height 5' 9" 5' 9" -  Weight 210 lbs 13 oz 218 lbs 10 oz -  BMI 41.32 44.0 -  Systolic 102 725 366  Diastolic 76 92 52  Pulse 67 92 68     Physical Exam   He looks systemically well.  He has lost a couple of pounds since the last time I saw him in the office in July.  Blood pressure is controlled.  He is alert and orientated without any focal neurological signs.    Assessment   1. Chronic midline low back pain without sciatica   2. Benign prostatic hyperplasia with incomplete bladder emptying   3. Mixed hyperlipidemia   4. Primary testicular failure   5. Vitamin D deficiency disease   6. Gastroesophageal reflux disease without esophagitis   7. Encounter for hepatitis C screening test for low risk patient       Tests ordered Orders Placed This Encounter  Procedures  . CMP with eGFR([facility])  . Lipid Panel  . Vitamin D, 25-hydroxy  . Hep C Antibody  . Ambulatory referral to Orthopedic Surgery     Plan: 1. Blood work is ordered as above. 2. I will send him to orthopedics in [place] for his low back pain for further evaluation. 3. He will continue with testosterone therapy for the time being. 4. He will continue with vitamin D3 supplementation and we will check levels. 5. He will continue with Flomax as this seems to be controlling his BPH symptoms better. 6. Further recommendations will depend on blood results and I will see him in the beginning of April for follow-up.   No orders of the defined types were placed in this encounter.   Doree Albee, MD

## 2018-11-29 LAB — COMPLETE METABOLIC PANEL WITH GFR
AG Ratio: 2.4 (calc) (ref 1.0–2.5)
ALT: 28 U/L (ref 9–46)
AST: 25 U/L (ref 10–35)
Albumin: 4.4 g/dL (ref 3.6–5.1)
Alkaline phosphatase (APISO): 71 U/L (ref 35–144)
BUN/Creatinine Ratio: 9 (calc) (ref 6–22)
BUN: 13 mg/dL (ref 7–25)
CO2: 30 mmol/L (ref 20–32)
Calcium: 10 mg/dL (ref 8.6–10.3)
Chloride: 104 mmol/L (ref 98–110)
Creat: 1.45 mg/dL — ABNORMAL HIGH (ref 0.70–1.25)
GFR, Est African American: 57 mL/min/{1.73_m2} — ABNORMAL LOW (ref >=60)
GFR, Est Non African American: 49 mL/min/{1.73_m2} — ABNORMAL LOW (ref >=60)
Globulin: 1.8 g/dL (calc) — ABNORMAL LOW (ref 1.9–3.7)
Glucose, Bld: 98 mg/dL (ref 65–99)
Potassium: 4.7 mmol/L (ref 3.5–5.3)
Sodium: 141 mmol/L (ref 135–146)
Total Bilirubin: 1 mg/dL (ref 0.2–1.2)
Total Protein: 6.2 g/dL (ref 6.1–8.1)

## 2018-11-29 LAB — LIPID PANEL
Cholesterol: 230 mg/dL — ABNORMAL HIGH (ref <200)
HDL: 30 mg/dL — ABNORMAL LOW (ref >=40)
LDL Cholesterol (Calc): 162 mg/dL (calc) — ABNORMAL HIGH
Non-HDL Cholesterol (Calc): 200 mg/dL (calc) — ABNORMAL HIGH (ref <130)
Total CHOL/HDL Ratio: 7.7 (calc) — ABNORMAL HIGH (ref <5.0)
Triglycerides: 225 mg/dL — ABNORMAL HIGH (ref <150)

## 2018-11-29 LAB — HEPATITIS C ANTIBODY
Hepatitis C Ab: NONREACTIVE
SIGNAL TO CUT-OFF: 0.12 (ref <1.00)

## 2018-11-29 LAB — VITAMIN D 25 HYDROXY (VIT D DEFICIENCY, FRACTURES): Vit D, 25-Hydroxy: 53 ng/mL (ref 30–100)

## 2018-12-01 ENCOUNTER — Encounter: Payer: Self-pay | Admitting: Orthopaedic Surgery

## 2018-12-13 ENCOUNTER — Encounter: Payer: Self-pay | Admitting: Orthopaedic Surgery

## 2018-12-13 ENCOUNTER — Other Ambulatory Visit: Payer: Self-pay

## 2018-12-13 ENCOUNTER — Ambulatory Visit (INDEPENDENT_AMBULATORY_CARE_PROVIDER_SITE_OTHER): Payer: Managed Care, Other (non HMO) | Admitting: Orthopaedic Surgery

## 2018-12-13 ENCOUNTER — Ambulatory Visit: Payer: Self-pay

## 2018-12-13 VITALS — BP 130/84 | HR 75 | Ht 69.0 in | Wt 205.0 lb

## 2018-12-13 DIAGNOSIS — M25552 Pain in left hip: Secondary | ICD-10-CM | POA: Diagnosis not present

## 2018-12-13 DIAGNOSIS — M79605 Pain in left leg: Secondary | ICD-10-CM | POA: Diagnosis not present

## 2018-12-13 DIAGNOSIS — M545 Low back pain, unspecified: Secondary | ICD-10-CM

## 2018-12-13 DIAGNOSIS — M25551 Pain in right hip: Secondary | ICD-10-CM | POA: Diagnosis not present

## 2018-12-13 MED ORDER — METAXALONE 800 MG PO TABS: 800.0000 mg | ORAL_TABLET | Freq: Three times a day (TID) | ORAL | 1 refills | Status: DC

## 2018-12-13 MED ORDER — TRAMADOL HCL 50 MG PO TABS: 50.0000 mg | ORAL_TABLET | Freq: Four times a day (QID) | ORAL | 2 refills | Status: AC | PRN

## 2018-12-13 NOTE — Progress Notes (Signed)
Subjective:    Patient ID: Frank Noble, male    DOB: Dec 27, 1950, 68 y.o.   MRN: [id number]  HPI He has had lower back pain with left sided sciatica for about four to five  months getting worse.  He has deep aching pain that is constant and wakes him up often more in the early morning hours.  He has no trauma.  He has seen Dr. Anastasio Champion for this and is referred here.  He has no weakness.  He has gotten tired of having the pain all the time.  He has tried Tylenol and Advil with only slight help.  He has used ice and heat with minimal help.  He had problems with his back over five years ago when he was in [place], [place].  He had scan then.     Review of Systems  Constitutional: Positive for activity change.  Musculoskeletal: Positive for arthralgias and back pain.  All other systems reviewed and are negative.  For Review of Systems, all other systems reviewed and are negative.  The following is a summary of the past history medically, past history surgically, known current medicines, social history and family history.  This information is gathered electronically by the computer from prior information and documentation.  I review this each visit and have found including this information at this point in the chart is beneficial and informative.   Past Medical History:  Diagnosis Date  . BPH (benign prostatic hyperplasia) 11/28/2018  . Colon adenomas 03/2016   5, polyps  . GERD (gastroesophageal reflux disease)   . HLD (hyperlipidemia) 11/28/2018  . Hypercholesteremia   . 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 Sleep apnea   . Vitamin D deficiency disease 11/28/2018    Past Surgical History:  Procedure Laterality Date  . COLONOSCOPY    . COLONOSCOPY N/A 04/05/2016   Procedure: COLONOSCOPY;  Surgeon: [name], MD;  Location: [facility];  Service: Endoscopy;  Laterality: N/A;  8:30 Am  . TONSILLECTOMY      Current Outpatient Medications on File Prior to Visit  Medication  Sig Dispense Refill  . Cholecalciferol (VITAMIN D-3) 5000 units TABS Take 10,000 Units by mouth daily.     . Needles & Syringes MISC 1 mm by Does not apply route as directed. 50 Syringe 0  . Omeprazole 20 MG TBDD Take 20 mg by mouth daily.    . Syringe, Disposable, (EASY GLIDE LUER LOCK SYRINGE) 1 ML MISC 1 Syringe by Does not apply route once a week. 100 each 0  . tadalafil (CIALIS) 5 MG tablet TAKE 1 TABLET DAILY 90 tablet 1  . testosterone cypionate (DEPOTESTOSTERONE CYPIONATE) 200 MG/ML injection Inject 0.4 mLs (80 mg total) into the muscle every 7 (seven) days. Injects 0.74ml on Sundays. (Patient taking differently: Inject 100 mg into the muscle every 7 (seven) days. Injects 0.7ml on Sundays.) 10 mL 0  . thyroid (ARMOUR) 60 MG tablet Take 60 mg by mouth daily before breakfast.     No current facility-administered medications on file prior to visit.     Social History   Socioeconomic History  . Marital status: Married    Spouse name: Not on file  . Number of children: 3  . Years of education: Not on file  . Highest education level: Not on file  Occupational History  . Occupation: [employer]  . Financial resource strain: Not on file  . Food insecurity    Worry: Not on file  Inability: Not on file  . Transportation needs    Medical: Not on file    Non-medical: Not on file  Tobacco Use  . Smoking status: Former Smoker    Packs/day: 0.50    Years: 10.00    Pack years: 5.00    Types: Cigarettes  . Smokeless tobacco: Never Used  . Tobacco comment: Quit age 74yrs  Substance and Sexual Activity  . Alcohol use: Yes    Alcohol/week: 7.0 standard drinks    Types: 7 Cans of beer per week  . Drug use: No  . Sexual activity: Not on file  Lifestyle  . Physical activity    Days per week: Not on file    Minutes per session: Not on file  . Stress: Not on file  Relationships  . Social Herbalist on phone: Not on file    Gets together: Not on file    Attends  religious service: Not on file    Active member of club or organization: Not on file    Attends meetings of clubs or organizations: Not on file    Relationship status: Not on file  . Intimate partner violence    Fear of current or ex partner: Not on file    Emotionally abused: Not on file    Physically abused: Not on file    Forced sexual activity: Not on file  Other Topics Concern  . 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    Family History  Problem Relation Age of Onset  . Breast cancer Mother        Mets to liver  . High blood pressure Father   . Aneurysm Father   . Colon cancer Neg Hx     BP 130/84   Pulse 75   Ht 5\' 9"  (1.753 m)   Wt 205 lb (93 kg)   BMI 30.27 kg/m   Body mass index is 30.27 kg/m.      Objective:   Physical Exam Vitals signs reviewed.  Constitutional:      Appearance: He is well-developed.  HENT:     Head: Normocephalic and atraumatic.  Eyes:     Conjunctiva/sclera: Conjunctivae normal.     Pupils: Pupils are equal, round, and reactive to light.  Neck:     Musculoskeletal: Normal range of motion and neck supple.  Cardiovascular:     Rate and Rhythm: Normal rate and regular rhythm.  Pulmonary:     Effort: Pulmonary effort is normal.  Abdominal:     Palpations: Abdomen is soft.  Skin:    General: Skin is warm and dry.  Neurological:     Mental Status: He is alert and oriented to person, place, and time.     Cranial Nerves: No cranial nerve deficit.     Motor: No abnormal muscle tone.     Coordination: Coordination normal.     Deep Tendon Reflexes: Reflexes are normal and symmetric. Reflexes normal.  Psychiatric:        Behavior: Behavior normal.        Thought Content: Thought content normal.        Judgment: Judgment normal.      X-rays were done of the lumbar spine, reported separately.     Assessment & Plan:    Encounter Diagnoses  Name Primary?  . Lumbar pain with radiation down left  leg Yes  . Pain in left hip   . Pain in right hip    I will give Rx for Ultram and Skelaxin. He has taken them in the past and did well with them.  Precautions discussed.  I will see him in two weeks.  He may need MRI if not improved.  Sheets of exercise instructions given.  I went over them.  Call if any problem.  Precautions discussed.   Electronically Signed Sanjuana Kava, MD 11/25/202010:05 AM

## 2018-12-19 ENCOUNTER — Ambulatory Visit: Payer: Managed Care, Other (non HMO) | Admitting: Orthopaedic Surgery

## 2018-12-26 ENCOUNTER — Other Ambulatory Visit (INDEPENDENT_AMBULATORY_CARE_PROVIDER_SITE_OTHER): Payer: Self-pay | Admitting: Internal Medicine

## 2018-12-27 ENCOUNTER — Ambulatory Visit (INDEPENDENT_AMBULATORY_CARE_PROVIDER_SITE_OTHER): Payer: Managed Care, Other (non HMO) | Admitting: Orthopaedic Surgery

## 2018-12-27 ENCOUNTER — Encounter: Payer: Self-pay | Admitting: Orthopaedic Surgery

## 2018-12-27 ENCOUNTER — Other Ambulatory Visit: Payer: Self-pay

## 2018-12-27 VITALS — BP 142/91 | HR 63 | Ht 69.0 in | Wt 211.0 lb

## 2018-12-27 DIAGNOSIS — M545 Low back pain: Secondary | ICD-10-CM

## 2018-12-27 DIAGNOSIS — M79605 Pain in left leg: Secondary | ICD-10-CM | POA: Diagnosis not present

## 2018-12-27 NOTE — Progress Notes (Signed)
Patient Frank Noble Frank Noble, male DOB:25-Mar-1950, 68 y.o. Frank Noble  Chief Complaint  Patient presents with  . Back Pain    Little discomfort still there/meds are helping some    HPI  Frank Noble is a 68 y.o. male who has continued midline lower back pain with some paresthesias more to the left than the right.  He has taken the medicine which only helped a little.  He has been doing his exercises but they hurt and he has cut back on them.  He is not getting better.  I will order a MRI of the lumbar spine.    Body mass index is 31.16 kg/m.  ROS  Review of Systems  All other systems reviewed and are negative.  The following is a summary of the past history medically, past history surgically, known current medicines, social history and family history.  This information is gathered electronically by the computer from prior information and documentation.  I review this each visit and have found including this information at this point in the chart is beneficial and informative.    Past Medical History:  Diagnosis Date  . BPH (benign prostatic hyperplasia) 11/28/2018  . Colon adenomas 03/2016   5, polyps  . GERD (gastroesophageal reflux disease)   . HLD (hyperlipidemia) 11/28/2018  . Hypercholesteremia   . 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 Sleep apnea   . Vitamin D deficiency disease 11/28/2018    Past Surgical History:  Procedure Laterality Date  . COLONOSCOPY    . COLONOSCOPY N/A 04/05/2016   Procedure: COLONOSCOPY;  Surgeon: [name], MD;  Location: [facility];  Service: Endoscopy;  Laterality: N/A;  8:30 Am  . TONSILLECTOMY      Family History  Problem Relation Age of Onset  . Breast cancer Mother        Mets to liver  . High blood pressure Father   . Aneurysm Father   . Colon cancer Neg Hx     Social History Social History   Tobacco Use  . Smoking status: Former Smoker    Packs/day: 0.50    Years: 10.00    Pack years: 5.00    Types:  Cigarettes  . Smokeless tobacco: Never Used  . Tobacco comment: Quit age 29yrs  Substance Use Topics  . Alcohol use: Yes    Alcohol/week: 7.0 standard drinks    Types: 7 Cans of beer per week  . Drug use: No    No Known Allergies  Current Outpatient Medications  Medication Sig Dispense Refill  . Cholecalciferol (VITAMIN D-3) 5000 units TABS Take 10,000 Units by mouth daily.     . metaxalone (SKELAXIN) 800 MG tablet Take 1 tablet (800 mg total) by mouth 3 (three) times daily. 90 tablet 1  . Needles & Syringes MISC 1 mm by Does not apply route as directed. 50 Syringe 0  . Omeprazole 20 MG TBDD Take 20 mg by mouth daily.    . Syringe, Disposable, (EASY GLIDE LUER LOCK SYRINGE) 1 ML MISC 1 Syringe by Does not apply route once a week. 100 each 0  . tadalafil (CIALIS) 5 MG tablet TAKE 1 TABLET DAILY 90 tablet 1  . tamsulosin (FLOMAX) 0.4 MG CAPS capsule TAKE 1 CAPSULE DAILY (NEW) 90 capsule 1  . testosterone cypionate (DEPOTESTOSTERONE CYPIONATE) 200 MG/ML injection Inject 0.4 mLs (80 mg total) into the muscle every 7 (seven) days. Injects 0.24ml on Sundays. (Patient taking differently: Inject 100 mg into the muscle every 7 (seven) days. Injects  0.39ml on Sundays.) 10 mL 0  . thyroid (ARMOUR) 60 MG tablet Take 60 mg by mouth daily before breakfast.     No current facility-administered medications for this visit.      Physical Exam  Blood pressure (!) 142/91, pulse 63, height 5\' 9"  (1.753 m), weight 211 lb (95.7 kg).  Constitutional: overall normal hygiene, normal nutrition, well developed, normal grooming, normal body habitus. Assistive device:none  Musculoskeletal: gait and station Limp none, muscle tone and strength are normal, no tremors or atrophy is present.  .  Neurological: coordination overall normal.  Deep tendon reflex/nerve stretch intact.  Sensation normal.  Cranial nerves II-XII intact.   Skin:   Normal overall no scars, lesions, ulcers or rashes. No  psoriasis.  Psychiatric: Alert and oriented x 3.  Recent memory intact, remote memory unclear.  Normal mood and affect. Well groomed.  Good eye contact.  Cardiovascular: overall no swelling, no varicosities, no edema bilaterally, normal temperatures of the legs and arms, no clubbing, cyanosis and good capillary refill.  Lymphatic: palpation is normal.  Spine/Pelvis examination:  Inspection:  Overall, sacoiliac joint benign and hips nontender; without crepitus or defects.   Thoracic spine inspection: Alignment normal without kyphosis present   Lumbar spine inspection:  Alignment  with normal lumbar lordosis, without scoliosis apparent.   Thoracic spine palpation:  without tenderness of spinal processes   Lumbar spine palpation: without tenderness of lumbar area; without tightness of lumbar muscles    Range of Motion:   Lumbar flexion, forward flexion is normal without pain or tenderness    Lumbar extension is full without pain or tenderness   Left lateral bend is normal without pain or tenderness   Right lateral bend is normal without pain or tenderness   Straight leg raising is normal  Strength & tone: normal   Stability overall normal stability  All other systems reviewed and are negative   The patient has been educated about the nature of the problem(s) and counseled on treatment options.  The patient appeared to understand what I have discussed and is in agreement with it.  Encounter Diagnosis  Name Primary?  . Lumbar pain with radiation down left leg Yes     PLAN Call if any problems.  Precautions discussed.  Continue current medications.   Return to clinic 2 weeks   For MRI of the lumbar spine.  .Electronically Signed Sanjuana Kava, MD 12/9/202010:48 AM

## 2018-12-29 NOTE — Addendum Note (Signed)
Addended by: Derek Mound A on: 12/29/2018 10:35 AM   Modules accepted: Orders

## 2019-01-10 ENCOUNTER — Ambulatory Visit (INDEPENDENT_AMBULATORY_CARE_PROVIDER_SITE_OTHER): Payer: Managed Care, Other (non HMO) | Admitting: Orthopaedic Surgery

## 2019-01-10 ENCOUNTER — Other Ambulatory Visit: Payer: Self-pay

## 2019-01-10 ENCOUNTER — Encounter: Payer: Self-pay | Admitting: Orthopaedic Surgery

## 2019-01-10 VITALS — BP 150/82 | HR 74 | Ht 69.0 in | Wt 213.0 lb

## 2019-01-10 DIAGNOSIS — M79605 Pain in left leg: Secondary | ICD-10-CM | POA: Diagnosis not present

## 2019-01-10 DIAGNOSIS — M545 Low back pain: Secondary | ICD-10-CM | POA: Diagnosis not present

## 2019-01-10 NOTE — Progress Notes (Signed)
Patient Frank Noble, male DOB:Sep 18, 1950, 68 y.o. [id number]  Chief Complaint  Patient presents with  . Back Pain    Its about the same/here to go over MRI    HPI  Frank Noble is a 68 y.o. male who has lower back pain with radiation on the right.  He had a MRI in [place] which showed multilevel degenerative changes with most significant findings of moderate bilateral neuroforamina narrowing at L4-L5.  I have explained the findings. I have suggested epidural injection.  He says he would rather wait and defer any injections for now.  His wife gets these for her back.  He will let me know if his pain gets worse.  He prefers to be seen as needed.   Body mass index is 31.45 kg/m.  ROS  Review of Systems  Constitutional: Positive for activity change.  Musculoskeletal: Positive for arthralgias and back pain.  All other systems reviewed and are negative.   All other systems reviewed and are negative.  The following is a summary of the past history medically, past history surgically, known current medicines, social history and family history.  This information is gathered electronically by the computer from prior information and documentation.  I review this each visit and have found including this information at this point in the chart is beneficial and informative.    Past Medical History:  Diagnosis Date  . BPH (benign prostatic hyperplasia) 11/28/2018  . Colon adenomas 03/2016   5, polyps  . GERD (gastroesophageal reflux disease)   . HLD (hyperlipidemia) 11/28/2018  . Hypercholesteremia   . 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 Sleep apnea   . Vitamin D deficiency disease 11/28/2018    Past Surgical History:  Procedure Laterality Date  . COLONOSCOPY    . COLONOSCOPY N/A 04/05/2016   Procedure: COLONOSCOPY;  Surgeon: [name], MD;  Location: [facility];  Service: Endoscopy;  Laterality: N/A;  8:30 Am  . TONSILLECTOMY      Family History  Problem  Relation Age of Onset  . Breast cancer Mother        Mets to liver  . High blood pressure Father   . Aneurysm Father   . Colon cancer Neg Hx     Social History Social History   Tobacco Use  . Smoking status: Former Smoker    Packs/day: 0.50    Years: 10.00    Pack years: 5.00    Types: Cigarettes  . Smokeless tobacco: Never Used  . Tobacco comment: Quit age 35yrs  Substance Use Topics  . Alcohol use: Yes    Alcohol/week: 7.0 standard drinks    Types: 7 Cans of beer per week  . Drug use: No    No Known Allergies  Current Outpatient Medications  Medication Sig Dispense Refill  . Cholecalciferol (VITAMIN D-3) 5000 units TABS Take 10,000 Units by mouth daily.     . metaxalone (SKELAXIN) 800 MG tablet Take 1 tablet (800 mg total) by mouth 3 (three) times daily. 90 tablet 1  . Needles & Syringes MISC 1 mm by Does not apply route as directed. 50 Syringe 0  . Omeprazole 20 MG TBDD Take 20 mg by mouth daily.    . Syringe, Disposable, (EASY GLIDE LUER LOCK SYRINGE) 1 ML MISC 1 Syringe by Does not apply route once a week. 100 each 0  . tadalafil (CIALIS) 5 MG tablet TAKE 1 TABLET DAILY 90 tablet 1  . tamsulosin (FLOMAX) 0.4 MG CAPS capsule TAKE 1  CAPSULE DAILY (NEW) 90 capsule 1  . testosterone cypionate (DEPOTESTOSTERONE CYPIONATE) 200 MG/ML injection Inject 0.4 mLs (80 mg total) into the muscle every 7 (seven) days. Injects 0.31ml on Sundays. (Patient taking differently: Inject 100 mg into the muscle every 7 (seven) days. Injects 0.65ml on Sundays.) 10 mL 0  . thyroid (ARMOUR) 60 MG tablet Take 60 mg by mouth daily before breakfast.     No current facility-administered medications for this visit.     Physical Exam  Blood pressure (!) 150/82, pulse 74, height 5\' 9"  (1.753 m), weight 213 lb (96.6 kg).  Constitutional: overall normal hygiene, normal nutrition, well developed, normal grooming, normal body habitus. Assistive device:none  Musculoskeletal: gait and station Limp none,  muscle tone and strength are normal, no tremors or atrophy is present.  .  Neurological: coordination overall normal.  Deep tendon reflex/nerve stretch intact.  Sensation normal.  Cranial nerves II-XII intact.   Skin:   Normal overall no scars, lesions, ulcers or rashes. No psoriasis.  Psychiatric: Alert and oriented x 3.  Recent memory intact, remote memory unclear.  Normal mood and affect. Well groomed.  Good eye contact.  Cardiovascular: overall no swelling, no varicosities, no edema bilaterally, normal temperatures of the legs and arms, no clubbing, cyanosis and good capillary refill.  Lymphatic: palpation is normal.  Spine/Pelvis examination:  Inspection:  Overall, sacoiliac joint benign and hips nontender; without crepitus or defects.   Thoracic spine inspection: Alignment normal without kyphosis present   Lumbar spine inspection:  Alignment  with normal lumbar lordosis, without scoliosis apparent.   Thoracic spine palpation:  without tenderness of spinal processes   Lumbar spine palpation: without tenderness of lumbar area; without tightness of lumbar muscles    Range of Motion:   Lumbar flexion, forward flexion is normal without pain or tenderness    Lumbar extension is full without pain or tenderness   Left lateral bend is normal without pain or tenderness   Right lateral bend is normal without pain or tenderness   Straight leg raising is normal  Strength & tone: normal   Stability overall normal stability  All other systems reviewed and are negative   The patient has been educated about the nature of the problem(s) and counseled on treatment options.  The patient appeared to understand what I have discussed and is in agreement with it.  Encounter Diagnosis  Name Primary?  . Lumbar pain with radiation down left leg Yes    PLAN Call if any problems.  Precautions discussed.  Continue current medications.   Return to clinic prn   Electronically Signed Sanjuana Kava, MD 12/23/20209:33 AM

## 2019-01-22 ENCOUNTER — Other Ambulatory Visit (INDEPENDENT_AMBULATORY_CARE_PROVIDER_SITE_OTHER): Payer: Self-pay | Admitting: Internal Medicine

## 2019-02-11 ENCOUNTER — Other Ambulatory Visit (INDEPENDENT_AMBULATORY_CARE_PROVIDER_SITE_OTHER): Payer: Self-pay | Admitting: Internal Medicine

## 2019-02-13 ENCOUNTER — Ambulatory Visit
Admission: EM | Admit: 2019-02-13 | Discharge: 2019-02-13 | Disposition: A | Discharge status: Home / Self Care | Payer: Managed Care, Other (non HMO)

## 2019-02-13 ENCOUNTER — Telehealth (INDEPENDENT_AMBULATORY_CARE_PROVIDER_SITE_OTHER): Payer: Self-pay

## 2019-02-13 ENCOUNTER — Other Ambulatory Visit: Payer: Self-pay

## 2019-02-13 DIAGNOSIS — M545 Low back pain: Secondary | ICD-10-CM | POA: Diagnosis not present

## 2019-02-13 DIAGNOSIS — G8929 Other chronic pain: Secondary | ICD-10-CM | POA: Diagnosis not present

## 2019-02-13 DIAGNOSIS — R030 Elevated blood-pressure reading, without diagnosis of hypertension: Secondary | ICD-10-CM

## 2019-02-13 NOTE — Telephone Encounter (Signed)
Pt called stated he is home and not feeling very well. Pt 176/95. he wants to know what do you want him to do at this time?

## 2019-02-13 NOTE — Telephone Encounter (Signed)
I cannot see him today, I am concerned about that blood pressure so he will need to go to an urgent care or emergency room today to be evaluated.

## 2019-02-13 NOTE — ED Provider Notes (Signed)
[facility]    CSN: [id number] Arrival date & time: 02/13/19  1712      History   Chief Complaint Chief Complaint  Patient presents with  . Back Pain  . Hypertension  . Dizziness    HPI Frank Noble is a 69 y.o. male.   Frank Noble 69 years old male with history of A. fib presented to the urgent care with a complaint of high blood pressure.  Symptom was associated with dizziness  lasting about 3 hours.  He also reports chronic back pain.  Denies precipitating event.  Report he has not used any medication.  Denies chest pain, chest tightness, headaches, blurred vision, nausea, vomiting and diarrhea  The history is provided by the patient. No language interpreter was used.  Back Pain Hypertension  Dizziness   Past Medical History:  Diagnosis Date  . BPH (benign prostatic hyperplasia) 11/28/2018  . Colon adenomas 03/2016   5, polyps  . GERD (gastroesophageal reflux disease)   . HLD (hyperlipidemia) 11/28/2018  . Hypercholesteremia   . 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 Sleep apnea   . Vitamin D deficiency disease 11/28/2018    Patient Active Problem List   Diagnosis Date Noted  . BPH (benign prostatic hyperplasia) 11/28/2018  . HLD (hyperlipidemia) 11/28/2018  . 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 Vitamin D deficiency disease 11/28/2018  . GERD (gastroesophageal reflux disease) 11/28/2018  . Special screening for malignant neoplasms, colon     Past Surgical History:  Procedure Laterality Date  . COLONOSCOPY    . COLONOSCOPY N/A 04/05/2016   Procedure: COLONOSCOPY;  Surgeon: [name], MD;  Location: [facility];  Service: Endoscopy;  Laterality: N/A;  8:30 Am  . TONSILLECTOMY         Home Medications    Prior to Admission medications   Medication Sig Start Date End Date Taking? Authorizing Provider  famotidine (PEPCID) 20 MG tablet Take 20 mg by mouth daily.   Yes [provider]  ARMOUR THYROID 60 MG  tablet TAKE 1 TABLET DAILY 01/22/19   Hurshel Party C, MD  Cholecalciferol (VITAMIN D-3) 5000 units TABS Take 10,000 Units by mouth daily.     [provider]  metaxalone (SKELAXIN) 800 MG tablet Take 1 tablet (800 mg total) by mouth 3 (three) times daily. 12/13/18   Sanjuana Kava, MD  Needles & Syringes MISC 1 mm by Does not apply route as directed. 10/18/18   Doree Albee, MD  Omeprazole 20 MG TBDD Take 20 mg by mouth daily.    [provider]  Syringe, Disposable, (EASY GLIDE LUER LOCK SYRINGE) 1 ML MISC 1 Syringe by Does not apply route once a week. 10/17/18   Doree Albee, MD  tadalafil (CIALIS) 5 MG tablet TAKE 1 TABLET DAILY 10/18/18   Hurshel Party C, MD  tamsulosin (FLOMAX) 0.4 MG CAPS capsule TAKE 1 CAPSULE DAILY (NEW) 12/26/18   Doree Albee, MD  testosterone cypionate (DEPOTESTOSTERONE CYPIONATE) 200 MG/ML injection Inject 0.5 mLs (100 mg total) into the muscle every 7 (seven) days. Injects 0.43ml on Sundays. 02/12/19   Doree Albee, MD  thyroid (ARMOUR) 60 MG tablet Take 60 mg by mouth daily before breakfast.    [provider]    Family History Family History  Problem Relation Age of Onset  . Breast cancer Mother        Mets to liver  . High blood pressure Father   . Aneurysm Father   .  Colon cancer Neg Hx     Social History Social History   Tobacco Use  . Smoking status: Former Smoker    Packs/day: 0.50    Years: 10.00    Pack years: 5.00    Types: Cigarettes  . Smokeless tobacco: Never Used  . Tobacco comment: Quit age 66yrs  Substance Use Topics  . Alcohol use: Yes    Alcohol/week: 8.0 standard drinks    Types: 7 Cans of beer, 1 Shots of liquor per week    Comment: everyday, at least one beer a day  . Drug use: No     Allergies   Patient has no known allergies.   Review of Systems Review of Systems  Constitutional: Negative.   Respiratory: Negative.   Cardiovascular: Negative.   Musculoskeletal: Positive  for back pain.  Neurological: Positive for dizziness.  All other systems reviewed and are negative.    Physical Exam Triage Vital Signs ED Triage Vitals  Enc Vitals Group     BP 02/13/19 1801 (!) 168/99     Pulse Rate 02/13/19 1801 66     Resp 02/13/19 1801 16     Temp 02/13/19 1801 98 F (36.7 C)     Temp Source 02/13/19 1801 Oral     SpO2 02/13/19 1801 97 %     Weight --      Height --      Head Circumference --      Peak Flow --      Pain Score 02/13/19 1804 0     Pain Loc --      Pain Edu? --      Excl. in [place]? --    No data found.  Updated Vital Signs BP (!) 147/91 (BP Location: Right Arm)   Pulse 66   Temp 98 F (36.7 C) (Oral)   Resp 16   SpO2 97%   Visual Acuity Right Eye Distance:   Left Eye Distance:   Bilateral Distance:    Right Eye Near:   Left Eye Near:    Bilateral Near:     Physical Exam Vitals and nursing note reviewed.  Constitutional:      General: He is not in acute distress.    Appearance: Normal appearance. He is normal weight. He is not ill-appearing, toxic-appearing or diaphoretic.  Cardiovascular:     Rate and Rhythm: Normal rate and regular rhythm.     Pulses: Normal pulses. No decreased pulses.     Heart sounds: Normal heart sounds.  Pulmonary:     Effort: Pulmonary effort is normal. No respiratory distress.     Breath sounds: Normal breath sounds. No stridor. No wheezing, rhonchi or rales.  Chest:     Chest wall: No swelling, tenderness or edema.  Neurological:     General: No focal deficit present.     Mental Status: He is alert and oriented to person, place, and time. Mental status is at baseline.     Cranial Nerves: No cranial nerve deficit.     Sensory: No sensory deficit.     Motor: No weakness.     Coordination: Coordination normal.     Gait: Gait normal.     Deep Tendon Reflexes: Reflexes normal.      UC Treatments / Results  Labs (all labs ordered are listed, but only abnormal results are displayed) Labs  Reviewed - No data to display  EKG   Radiology No results found.  Procedures Procedures (including critical care time)  Medications Ordered in UC Medications - No data to display  Initial Impression / Assessment and Plan / UC Course  I have reviewed the triage vital signs and the nursing notes.  Pertinent labs & imaging results that were available during my care of the patient were reviewed by me and considered in my medical decision making (see chart for details).     Patient is stable for discharge.  Repeat blood pressure measurement was 147/91 with no symptom. Advised patient to follow-up with primary care Advised for low-salt diet, to exercise regularly and manage stress To return or go to ED for worsening of symptoms  Final Clinical Impressions(s) / UC Diagnoses   Final diagnoses:  Elevated blood pressure reading     Discharge Instructions     Repeat blood pressure measurement was 147/91 Advised patient for low-salt diet Exercise regularly and manage stress Continue to take OTC  ibuprofen for back pain To go to ED or return for worsening of symptoms    ED Prescriptions    None     PDMP not reviewed this encounter.   Emerson Monte, [place] 02/13/19 [phone]

## 2019-02-13 NOTE — ED Triage Notes (Addendum)
Pt presents to UC w/ c/o hypertension today of 170/99. Pt states he has hx of afib and has felt some moments of heart fluttering today. Mid lower back pain, usually worse in AM. Pt states orthopedic states it is arthritis. Dizziness at 2pm today lasting 3 hours. Pt states he's had frequent urination and BM's today. Pt states he had his first dose of covid vaccine last week.

## 2019-02-13 NOTE — Discharge Instructions (Signed)
Repeat blood pressure measurement was 147/91 Advised patient for low-salt diet Exercise regularly and manage stress Continue to take OTC  ibuprofen for back pain Follow-up with primary care To go to ED or return for worsening of symptoms

## 2019-02-13 NOTE — Telephone Encounter (Signed)
Pt is going to the urgent care to be seen.

## 2019-02-14 ENCOUNTER — Ambulatory Visit (INDEPENDENT_AMBULATORY_CARE_PROVIDER_SITE_OTHER): Payer: Managed Care, Other (non HMO) | Admitting: Internal Medicine

## 2019-02-14 ENCOUNTER — Encounter (INDEPENDENT_AMBULATORY_CARE_PROVIDER_SITE_OTHER): Payer: Self-pay | Admitting: Internal Medicine

## 2019-02-14 VITALS — BP 167/98 | HR 64 | Temp 98.3°F | Resp 18 | Ht 69.0 in | Wt 213.0 lb

## 2019-02-14 DIAGNOSIS — I10 Essential (primary) hypertension: Secondary | ICD-10-CM | POA: Diagnosis not present

## 2019-02-14 DIAGNOSIS — E782 Mixed hyperlipidemia: Secondary | ICD-10-CM

## 2019-02-14 NOTE — Progress Notes (Signed)
Metrics: Intervention Frequency ACO  Documented Smoking Status Yearly  Screened one or more times in 24 months  Cessation Counseling or  Active cessation medication Past 24 months  Past 24 months   Guideline developer: UpToDate (See UpToDate for funding source) Date Released: 2014       Wellness Office Visit  Subjective:  Patient ID: Frank Noble, male    DOB: 10/20/1950  Age: 69 y.o. MRN: [id number]  CC: This man comes in because he had symptoms of dizziness and generally feeling unwell and noticed that his blood pressure was elevated and was recommended by me to go to the urgent care yesterday as I could not see him in the office. HPI I reviewed the notes from the urgent care and his blood pressure did settle down to better numbers after he was at the urgent care for about an hour.  He tells me that he has been keeping a check on his blood pressure with his home monitor and it has been trending up in the last 2 to 3 months.  He denies any chest pain, dyspnea, palpitations, new limb weakness, speech difficulties.  He does not have a headache. When we did the blood work on the last time I saw him in November of last year, his creatinine was elevated and I encouraged him to drink more water and stay hydrated. He also has a history of hyperlipidemia and used to take statin therapy.  He wonders whether he should go back on statin therapy now.  Past Medical History:  Diagnosis Date  . BPH (benign prostatic hyperplasia) 11/28/2018  . Colon adenomas 03/2016   5, polyps  . GERD (gastroesophageal reflux disease)   . HLD (hyperlipidemia) 11/28/2018  . Hypercholesteremia   . 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 Sleep apnea   . Vitamin D deficiency disease 11/28/2018      Family History  Problem Relation Age of Onset  . Breast cancer Mother        Mets to liver  . High blood pressure Father   . Aneurysm Father   . 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   Social History   Tobacco Use  . Smoking status: Former Smoker    Packs/day: 0.50    Years: 10.00    Pack years: 5.00    Types: Cigarettes  . Smokeless tobacco: Never Used  . Tobacco comment: Quit age 33yrs  Substance Use Topics  . Alcohol use: Yes    Alcohol/week: 8.0 standard drinks    Types: 7 Cans of beer, 1 Shots of liquor per week    Comment: everyday, at least one beer a day    Current Meds  Medication Sig  . ARMOUR THYROID 60 MG tablet TAKE 1 TABLET DAILY  . Cholecalciferol (VITAMIN D-3) 5000 units TABS Take 10,000 Units by mouth daily.   . famotidine (PEPCID) 20 MG tablet Take 20 mg by mouth daily.  . metaxalone (SKELAXIN) 800 MG tablet Take 1 tablet (800 mg total) by mouth 3 (three) times daily.  . Needles & Syringes MISC 1 mm by Does not apply route as directed.  . Omeprazole 20 MG TBDD Take 20 mg by mouth daily.  . Syringe, Disposable, (EASY GLIDE LUER LOCK SYRINGE) 1 ML MISC 1 Syringe by Does not apply route once a week.  Marland Kitchen  tadalafil (CIALIS) 5 MG tablet TAKE 1 TABLET DAILY  . tamsulosin (FLOMAX) 0.4 MG CAPS capsule TAKE 1 CAPSULE DAILY (NEW)  . testosterone cypionate (DEPOTESTOSTERONE CYPIONATE) 200 MG/ML injection Inject 0.5 mLs (100 mg total) into the muscle every 7 (seven) days. Injects 0.32ml on Sundays.        Objective:   Today's Vitals: BP (!) 167/98 (BP Location: Right Arm, Patient Position: Sitting, Cuff Size: Normal)   Pulse 64   Temp 98.3 F (36.8 C) (Temporal)   Resp 18   Ht 5\' 9"  (1.753 m)   Wt 213 lb (96.6 kg)   SpO2 98% Comment: wearing mask  BMI 31.45 kg/m  Vitals with BMI 02/14/2019 02/14/2019 02/14/2019  Height - - 5\' 9"   Weight - - 213 lbs  BMI - - 99991111  Systolic [id number]  Diastolic 98 123456 92  Pulse 64 - 89     Physical Exam   He looks systemically well.  His blood pressure is uncontrolled.  However, he  is alert and orientated and does not have any focal neurological signs.  He has gained about 3 pounds since the last time I saw him.    Assessment   1. Essential hypertension, benign   2. Mixed hyperlipidemia       Tests ordered Orders Placed This Encounter  Procedures  . COMPLETE METABOLIC PANEL WITH GFR  . Lipid panel     Plan: 1. Blood work is ordered above to see what his renal function is and his cholesterol levels. 2. I would like to put him on an ARB such as losartan but this will depend on the renal function.  Otherwise we may have to put him on a calcium channel blocker such as amlodipine.  His heart rate is a little bit too low for beta-blocker at this time.  We could also use an alpha-blocker such as hydralazine. 3. I have told him that if his cholesterol numbers are higher I will certainly put him on statin therapy if he would like to. 4. Further recommendations will depend on these results and I will let him know tomorrow once I get the results back.   No orders of the defined types were placed in this encounter.   Doree Albee, MD

## 2019-02-15 ENCOUNTER — Other Ambulatory Visit (INDEPENDENT_AMBULATORY_CARE_PROVIDER_SITE_OTHER): Payer: Self-pay | Admitting: Internal Medicine

## 2019-02-15 LAB — LIPID PANEL
Cholesterol: 223 mg/dL — ABNORMAL HIGH (ref <200)
HDL: 26 mg/dL — ABNORMAL LOW (ref >=40)
LDL Cholesterol (Calc): 146 mg/dL (calc) — ABNORMAL HIGH
Non-HDL Cholesterol (Calc): 197 mg/dL (calc) — ABNORMAL HIGH (ref <130)
Total CHOL/HDL Ratio: 8.6 (calc) — ABNORMAL HIGH (ref <5.0)
Triglycerides: 331 mg/dL — ABNORMAL HIGH (ref <150)

## 2019-02-15 LAB — COMPLETE METABOLIC PANEL WITH GFR
AG Ratio: 2.3 (calc) (ref 1.0–2.5)
ALT: 28 U/L (ref 9–46)
AST: 27 U/L (ref 10–35)
Albumin: 4.6 g/dL (ref 3.6–5.1)
Alkaline phosphatase (APISO): 70 U/L (ref 35–144)
BUN/Creatinine Ratio: 9 (calc) (ref 6–22)
BUN: 12 mg/dL (ref 7–25)
CO2: 30 mmol/L (ref 20–32)
Calcium: 9.1 mg/dL (ref 8.6–10.3)
Chloride: 104 mmol/L (ref 98–110)
Creat: 1.39 mg/dL — ABNORMAL HIGH (ref 0.70–1.25)
GFR, Est African American: 60 mL/min/{1.73_m2} (ref >=60)
GFR, Est Non African American: 52 mL/min/{1.73_m2} — ABNORMAL LOW (ref >=60)
Globulin: 2 g/dL (calc) (ref 1.9–3.7)
Glucose, Bld: 94 mg/dL (ref 65–99)
Potassium: 4.6 mmol/L (ref 3.5–5.3)
Sodium: 142 mmol/L (ref 135–146)
Total Bilirubin: 0.9 mg/dL (ref 0.2–1.2)
Total Protein: 6.6 g/dL (ref 6.1–8.1)

## 2019-02-15 MED ORDER — AMLODIPINE BESYLATE 5 MG PO TABS: 5.0000 mg | ORAL_TABLET | Freq: Every day | ORAL | 3 refills | Status: DC

## 2019-02-17 ENCOUNTER — Encounter (INDEPENDENT_AMBULATORY_CARE_PROVIDER_SITE_OTHER): Payer: Self-pay | Admitting: Internal Medicine

## 2019-02-19 ENCOUNTER — Other Ambulatory Visit (INDEPENDENT_AMBULATORY_CARE_PROVIDER_SITE_OTHER): Payer: Self-pay | Admitting: Internal Medicine

## 2019-02-19 MED ORDER — ATORVASTATIN CALCIUM 20 MG PO TABS: 20.0000 mg | ORAL_TABLET | Freq: Every day | ORAL | 0 refills | Status: DC

## 2019-02-19 NOTE — Telephone Encounter (Signed)
Pt came in office to see Dr Gosrani1/28/2021.

## 2019-03-06 ENCOUNTER — Encounter (INDEPENDENT_AMBULATORY_CARE_PROVIDER_SITE_OTHER): Payer: Self-pay | Admitting: Internal Medicine

## 2019-03-06 DIAGNOSIS — I10 Essential (primary) hypertension: Secondary | ICD-10-CM

## 2019-03-07 MED ORDER — AMLODIPINE BESYLATE 5 MG PO TABS: 5.0000 mg | ORAL_TABLET | Freq: Every day | ORAL | 0 refills | Status: DC

## 2019-03-09 ENCOUNTER — Other Ambulatory Visit (INDEPENDENT_AMBULATORY_CARE_PROVIDER_SITE_OTHER): Payer: Self-pay | Admitting: Internal Medicine

## 2019-04-02 ENCOUNTER — Encounter: Payer: Self-pay | Admitting: Gastroenterology

## 2019-04-11 ENCOUNTER — Other Ambulatory Visit (INDEPENDENT_AMBULATORY_CARE_PROVIDER_SITE_OTHER): Payer: Self-pay | Admitting: Internal Medicine

## 2019-04-12 ENCOUNTER — Encounter (INDEPENDENT_AMBULATORY_CARE_PROVIDER_SITE_OTHER): Payer: Self-pay | Admitting: Internal Medicine

## 2019-04-17 ENCOUNTER — Encounter (INDEPENDENT_AMBULATORY_CARE_PROVIDER_SITE_OTHER): Payer: Self-pay | Admitting: Internal Medicine

## 2019-04-17 ENCOUNTER — Ambulatory Visit (INDEPENDENT_AMBULATORY_CARE_PROVIDER_SITE_OTHER): Payer: Managed Care, Other (non HMO) | Admitting: Internal Medicine

## 2019-04-17 ENCOUNTER — Other Ambulatory Visit: Payer: Self-pay

## 2019-04-17 VITALS — BP 130/76 | HR 70 | Temp 98.1°F | Resp 18 | Ht 69.0 in | Wt 211.0 lb

## 2019-04-17 DIAGNOSIS — E291 Testicular hypofunction: Secondary | ICD-10-CM | POA: Diagnosis not present

## 2019-04-17 DIAGNOSIS — I10 Essential (primary) hypertension: Secondary | ICD-10-CM

## 2019-04-17 DIAGNOSIS — R1012 Left upper quadrant pain: Secondary | ICD-10-CM

## 2019-04-17 DIAGNOSIS — E782 Mixed hyperlipidemia: Secondary | ICD-10-CM

## 2019-04-17 DIAGNOSIS — E559 Vitamin D deficiency, unspecified: Secondary | ICD-10-CM

## 2019-04-17 NOTE — Progress Notes (Signed)
Metrics: Intervention Frequency ACO  Documented Smoking Status Yearly  Screened one or more times in 24 months  Cessation Counseling or  Active cessation medication Past 24 months  Past 24 months   Guideline developer: UpToDate (See UpToDate for funding source) Date Released: 2014       Wellness Office Visit  Subjective:  Patient ID: Frank Noble, male    DOB: 1950/01/26  Age: 69 y.o. MRN: [id number]  CC: This man comes in for follow-up of hypertension, hyperlipidemia, hypogonadism. HPI  Today he is also complaining of left upper quadrant abdominal discomfort which has been present for at least 4 to 6 weeks.  He says that he wakes up with it in the morning but then he takes Tylenol and then seems to be okay for the rest of the day.  There is no nausea or vomiting.  His bowels appear to be behaving normally. He continues with antihypertensive therapy.  He denies any chest pain, dyspnea, palpitations or limb weakness. He continues with testosterone therapy as before. He also has restarted atorvastatin for his hyperlipidemia and he seems to tolerate this reasonably well. Past Medical History:  Diagnosis Date  . BPH (benign prostatic hyperplasia) 11/28/2018  . Colon adenomas 03/2016   5, polyps  . GERD (gastroesophageal reflux disease)   . HLD (hyperlipidemia) 11/28/2018  . Hypercholesteremia   . 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 Sleep apnea   . Vitamin D deficiency disease 11/28/2018      Family History  Problem Relation Age of Onset  . Breast cancer Mother        Mets to liver  . High blood pressure Father   . Aneurysm Father   . Colon cancer Neg Hx     Social History   Social History Narrative         Married for 41 years.Lives with wife.Retired since Dec 2020..Originally from [place],in [place] for 42 years.    Social History   Tobacco Use  . Smoking status: Former Smoker    Packs/day: 0.50    Years: 10.00    Pack years: 5.00    Types: Cigarettes  .  Smokeless tobacco: Never Used  . Tobacco comment: Quit age 54yrs  Substance Use Topics  . Alcohol use: Yes    Alcohol/week: 8.0 standard drinks    Types: 7 Cans of beer, 1 Shots of liquor per week    Comment: everyday, at least one beer a day    Current Meds  Medication Sig  . amLODipine (NORVASC) 5 MG tablet Take 1 tablet (5 mg total) by mouth daily.  Francia Greaves THYROID 60 MG tablet TAKE 1 TABLET DAILY  . atorvastatin (LIPITOR) 20 MG tablet Take 1 tablet (20 mg total) by mouth daily.  . Cholecalciferol (VITAMIN D-3) 5000 units TABS Take 10,000 Units by mouth daily.   . famotidine (PEPCID) 20 MG tablet Take 20 mg by mouth daily.  . metaxalone (SKELAXIN) 800 MG tablet Take 1 tablet (800 mg total) by mouth 3 (three) times daily.  . Needles & Syringes MISC 1 mm by Does not apply route as directed.  . Omeprazole 20 MG TBDD Take 20 mg by mouth daily.  . Syringe, Disposable, (EASY GLIDE LUER LOCK SYRINGE) 1 ML MISC 1 Syringe by Does not apply route once a week.  . tadalafil (CIALIS) 5 MG tablet TAKE 1 TABLET DAILY  . tamsulosin (FLOMAX) 0.4 MG CAPS capsule TAKE 1 CAPSULE DAILY (NEW)  . testosterone cypionate (DEPOTESTOSTERONE CYPIONATE) 200 MG/ML injection  INJECT 0.5 MLS (100 MG TOTAL) INTO THE MUSCLE EVERY 7 (SEVEN) DAYS. INJECTS 0.5ML ON SUNDAYS.      Objective:   Today's Vitals: BP 130/76 (BP Location: Left Arm, Patient Position: Sitting, Cuff Size: Normal)   Pulse 70   Temp 98.1 F (36.7 C) (Temporal)   Resp 18   Ht 5\' 9"  (1.753 m)   Wt 211 lb (95.7 kg)   SpO2 94%   BMI 31.16 kg/m  Vitals with BMI 04/17/2019 02/14/2019 02/14/2019  Height 5\' 9"  - -  Weight 211 lbs - -  BMI 99991111 - -  Systolic [id number]  Diastolic 76 98 123456  Pulse 70 64 -     Physical Exam   He looks systemically well.  Blood pressure is actually quite well controlled today which is good news.  Abdominal examination shows a soft abdomen with I believe splenomegaly but I am not completely sure.  There is  some discomfort in the left upper quadrant but not  acute pain.    Assessment   1. Essential hypertension, benign   2. Primary testicular failure   3. Vitamin D deficiency disease   4. LUQ pain   5. Mixed hyperlipidemia       Tests ordered Orders Placed This Encounter  Procedures  . US Abdomen Complete  . CBC  . COMPLETE METABOLIC PANEL WITH GFR  . Lipid panel  . Testosterone Total,Free,Bio, Males  . T3, free     Plan: 1. I will arrange for him to have an ultrasound of the abdomen, specifically looking at the spleen. 2. He will continue with the same antihypertensive therapy and his blood pressure seems to be controlled with this. 3. He will continue with testosterone therapy.  We will check levels today. 4. He will continue with statin therapy and we will check lipid panel and liver enzymes as well as kidney function today. 5. Further recommendations will depend on blood results and I will see him in 4 months time for follow-up.   No orders of the defined types were placed in this encounter.   Doree Albee, MD

## 2019-04-18 LAB — COMPLETE METABOLIC PANEL WITH GFR
AG Ratio: 2.4 (calc) (ref 1.0–2.5)
ALT: 33 U/L (ref 9–46)
AST: 26 U/L (ref 10–35)
Albumin: 4.5 g/dL (ref 3.6–5.1)
Alkaline phosphatase (APISO): 69 U/L (ref 35–144)
BUN/Creatinine Ratio: 11 (calc) (ref 6–22)
BUN: 14 mg/dL (ref 7–25)
CO2: 28 mmol/L (ref 20–32)
Calcium: 9.5 mg/dL (ref 8.6–10.3)
Chloride: 103 mmol/L (ref 98–110)
Creat: 1.29 mg/dL — ABNORMAL HIGH (ref 0.70–1.25)
GFR, Est African American: 66 mL/min/{1.73_m2} (ref >=60)
GFR, Est Non African American: 57 mL/min/{1.73_m2} — ABNORMAL LOW (ref >=60)
Globulin: 1.9 g/dL (calc) (ref 1.9–3.7)
Glucose, Bld: 100 mg/dL — ABNORMAL HIGH (ref 65–99)
Potassium: 4.2 mmol/L (ref 3.5–5.3)
Sodium: 140 mmol/L (ref 135–146)
Total Bilirubin: 1.1 mg/dL (ref 0.2–1.2)
Total Protein: 6.4 g/dL (ref 6.1–8.1)

## 2019-04-18 LAB — LIPID PANEL
Cholesterol: 139 mg/dL (ref <200)
HDL: 30 mg/dL — ABNORMAL LOW (ref >=40)
LDL Cholesterol (Calc): 85 mg/dL (calc)
Non-HDL Cholesterol (Calc): 109 mg/dL (calc) (ref <130)
Total CHOL/HDL Ratio: 4.6 (calc) (ref <5.0)
Triglycerides: 139 mg/dL (ref <150)

## 2019-04-18 LAB — T3, FREE: T3, Free: 5 pg/mL — ABNORMAL HIGH (ref 2.3–4.2)

## 2019-04-18 LAB — CBC
HCT: 58 % — ABNORMAL HIGH (ref 38.5–50.0)
Hemoglobin: 19.8 g/dL — ABNORMAL HIGH (ref 13.2–17.1)
MCH: 33.2 pg — ABNORMAL HIGH (ref 27.0–33.0)
MCHC: 34.1 g/dL (ref 32.0–36.0)
MCV: 97.3 fL (ref 80.0–100.0)
MPV: 11.2 fL (ref 7.5–12.5)
Platelets: 131 10*3/uL — ABNORMAL LOW (ref 140–400)
RBC: 5.96 10*6/uL — ABNORMAL HIGH (ref 4.20–5.80)
RDW: 12.2 % (ref 11.0–15.0)
WBC: 4.9 10*3/uL (ref 3.8–10.8)

## 2019-04-18 LAB — TESTOSTERONE TOTAL,FREE,BIO, MALES
Albumin: 4.5 g/dL (ref 3.6–5.1)
Sex Hormone Binding: 28 nmol/L (ref 22–77)
Testosterone, Bioavailable: 509.1 ng/dL (ref >=110.0)
Testosterone, Free: 247.5 pg/mL — ABNORMAL HIGH (ref 46.0–224.0)
Testosterone: 1255 ng/dL — ABNORMAL HIGH (ref 250–827)

## 2019-04-23 ENCOUNTER — Ambulatory Visit (INDEPENDENT_AMBULATORY_CARE_PROVIDER_SITE_OTHER): Payer: Managed Care, Other (non HMO) | Admitting: Internal Medicine

## 2019-04-25 ENCOUNTER — Other Ambulatory Visit: Payer: Self-pay

## 2019-04-25 ENCOUNTER — Ambulatory Visit (HOSPITAL_COMMUNITY)
Admission: RE | Admit: 2019-04-25 | Discharge: 2019-04-25 | Disposition: A | Discharge status: Home / Self Care | Payer: Medicare HMO | Source: Ambulatory Visit | Attending: Internal Medicine | Admitting: Internal Medicine

## 2019-04-25 DIAGNOSIS — R1012 Left upper quadrant pain: Secondary | ICD-10-CM | POA: Diagnosis not present

## 2019-04-25 DIAGNOSIS — K760 Fatty (change of) liver, not elsewhere classified: Secondary | ICD-10-CM | POA: Diagnosis not present

## 2019-05-09 ENCOUNTER — Encounter (INDEPENDENT_AMBULATORY_CARE_PROVIDER_SITE_OTHER): Payer: Self-pay | Admitting: Internal Medicine

## 2019-05-10 ENCOUNTER — Ambulatory Visit (INDEPENDENT_AMBULATORY_CARE_PROVIDER_SITE_OTHER): Payer: Managed Care, Other (non HMO) | Admitting: Internal Medicine

## 2019-05-14 ENCOUNTER — Other Ambulatory Visit (INDEPENDENT_AMBULATORY_CARE_PROVIDER_SITE_OTHER): Payer: Self-pay | Admitting: Internal Medicine

## 2019-05-14 MED ORDER — THYROID 60 MG PO TABS: 60.0000 mg | ORAL_TABLET | Freq: Every day | ORAL | 1 refills | Status: DC

## 2019-05-21 ENCOUNTER — Telehealth: Payer: Self-pay | Admitting: Gastroenterology

## 2019-05-21 ENCOUNTER — Ambulatory Visit: Payer: Managed Care, Other (non HMO)

## 2019-05-21 NOTE — Telephone Encounter (Signed)
Patient cancelled his OV with [place] and said he was going to [place] and asked to forward his records there. I have faxed his last colonoscopy and path report to them.

## 2019-05-21 NOTE — Telephone Encounter (Signed)
REVIEWED-NO ADDITIONAL RECOMMENDATIONS. 

## 2019-05-22 ENCOUNTER — Telehealth: Payer: Self-pay | Admitting: Gastroenterology

## 2019-05-22 NOTE — Telephone Encounter (Signed)
Dr. Silverio Decamp (D.O.D)  We received records for this patient he would like to transfer care.  He  Would like to schedule an appt for Colonoscopy since he is due. I will send you his records for your review. Will you accept this pt?

## 2019-05-23 ENCOUNTER — Encounter: Payer: Self-pay | Admitting: Gastroenterology

## 2019-05-23 NOTE — Telephone Encounter (Signed)
Ok to schedule RN pre visit with direct access for colonoscopy for h/o multiple adenomatous colon polyps X4 removed in March 2018. He is due for surveillance colonoscopy. Thanks

## 2019-05-25 ENCOUNTER — Other Ambulatory Visit (INDEPENDENT_AMBULATORY_CARE_PROVIDER_SITE_OTHER): Payer: Self-pay | Admitting: Internal Medicine

## 2019-06-27 ENCOUNTER — Other Ambulatory Visit: Payer: Self-pay

## 2019-06-27 ENCOUNTER — Ambulatory Visit (AMBULATORY_SURGERY_CENTER): Payer: Self-pay | Admitting: *Deleted

## 2019-06-27 VITALS — Ht 69.0 in | Wt 214.0 lb

## 2019-06-27 DIAGNOSIS — Z86010 Personal history of colonic polyps: Secondary | ICD-10-CM

## 2019-06-27 MED ORDER — SUTAB 1479-225-188 MG PO TABS: 1.0000 | ORAL_TABLET | Freq: Once | ORAL | 0 refills | Status: AC

## 2019-06-27 NOTE — Progress Notes (Signed)
Per pt 2nd dose of covid vaccine in February  Pt is aware that care partner will wait in the car during procedure; if they feel like they will be too hot or cold to wait in the car; they may wait in the 4 th floor lobby. Patient is aware to bring only one care partner. We want them to wear a mask (we do not have any that we can provide them), practice social distancing, and we will check their temperatures when they get here.  I did remind the patient that their care partner needs to stay in the parking lot the entire time and have a cell phone available, we will call them when the pt is ready for discharge. Patient will wear mask into building.  No trouble moving neck per pt, no trouble with anesthesia or fam hx/hx of malignant hyperthermia   Sutab code put into RX and paper copy given to pt to show pharmacy    No egg or soy allergy  No home oxygen use   No medications for weight loss taken  Pt denies constipation issues

## 2019-06-28 ENCOUNTER — Other Ambulatory Visit (INDEPENDENT_AMBULATORY_CARE_PROVIDER_SITE_OTHER): Payer: Self-pay | Admitting: Internal Medicine

## 2019-06-28 MED ORDER — OMEPRAZOLE 20 MG PO TBDD: 20.0000 mg | DELAYED_RELEASE_TABLET | Freq: Every day | ORAL | 1 refills | Status: DC

## 2019-07-02 ENCOUNTER — Other Ambulatory Visit (INDEPENDENT_AMBULATORY_CARE_PROVIDER_SITE_OTHER): Payer: Self-pay | Admitting: Internal Medicine

## 2019-07-02 ENCOUNTER — Encounter (INDEPENDENT_AMBULATORY_CARE_PROVIDER_SITE_OTHER): Payer: Self-pay | Admitting: Internal Medicine

## 2019-07-02 MED ORDER — OMEPRAZOLE 20 MG PO TBDD: 20.0000 mg | DELAYED_RELEASE_TABLET | Freq: Every day | ORAL | 1 refills | Status: DC

## 2019-07-03 ENCOUNTER — Encounter (INDEPENDENT_AMBULATORY_CARE_PROVIDER_SITE_OTHER): Payer: Self-pay | Admitting: Internal Medicine

## 2019-07-10 ENCOUNTER — Ambulatory Visit (AMBULATORY_SURGERY_CENTER): Payer: Medicare HMO | Admitting: Gastroenterology

## 2019-07-10 ENCOUNTER — Other Ambulatory Visit: Payer: Self-pay

## 2019-07-10 ENCOUNTER — Encounter: Payer: Self-pay | Admitting: Gastroenterology

## 2019-07-10 VITALS — BP 122/80 | HR 51 | Temp 97.1°F | Resp 11 | Ht 69.0 in | Wt 214.0 lb

## 2019-07-10 DIAGNOSIS — Z1211 Encounter for screening for malignant neoplasm of colon: Secondary | ICD-10-CM | POA: Diagnosis not present

## 2019-07-10 DIAGNOSIS — D123 Benign neoplasm of transverse colon: Secondary | ICD-10-CM

## 2019-07-10 DIAGNOSIS — Z86010 Personal history of colonic polyps: Secondary | ICD-10-CM | POA: Diagnosis not present

## 2019-07-10 DIAGNOSIS — D122 Benign neoplasm of ascending colon: Secondary | ICD-10-CM | POA: Diagnosis not present

## 2019-07-10 HISTORY — PX: COLONOSCOPY: SHX174

## 2019-07-10 MED ORDER — SODIUM CHLORIDE 0.9 % IV SOLN
500.0000 mL | Freq: Once | INTRAVENOUS | Status: DC
Start: 2019-07-10 — End: 2020-02-26

## 2019-07-10 NOTE — Progress Notes (Signed)
Called to room to assist during endoscopic procedure.  Patient ID and intended procedure confirmed with present staff. Received instructions for my participation in the procedure from the performing physician.  

## 2019-07-10 NOTE — Progress Notes (Signed)
A/ox3, pleased with MAC, report to RN 

## 2019-07-10 NOTE — Patient Instructions (Signed)
Handouts Provided:  Diverticulosis and Polyps  YOU HAD AN ENDOSCOPIC PROCEDURE TODAY AT THE [facility]:   Refer to the procedure report that was given to you for any specific questions about what was found during the examination.  If the procedure report does not answer your questions, please call your gastroenterologist to clarify.  If you requested that your care partner not be given the details of your procedure findings, then the procedure report has been included in a sealed envelope for you to review at your convenience later.  YOU SHOULD EXPECT: Some feelings of bloating in the abdomen. Passage of more gas than usual.  Walking can help get rid of the air that was put into your GI tract during the procedure and reduce the bloating. If you had a lower endoscopy (such as a colonoscopy or flexible sigmoidoscopy) you may notice spotting of blood in your stool or on the toilet paper. If you underwent a bowel prep for your procedure, you may not have a normal bowel movement for a few days.  Please Note:  You might notice some irritation and congestion in your nose or some drainage.  This is from the oxygen used during your procedure.  There is no need for concern and it should clear up in a day or so.  SYMPTOMS TO REPORT IMMEDIATELY:   Following lower endoscopy (colonoscopy or flexible sigmoidoscopy):  Excessive amounts of blood in the stool  Significant tenderness or worsening of abdominal pains  Swelling of the abdomen that is new, acute  Fever of 100F or higher  For urgent or emergent issues, a gastroenterologist can be reached at any hour by calling [phone]. Do not use MyChart messaging for urgent concerns.    DIET:  We do recommend a small meal at first, but then you may proceed to your regular diet.  Drink plenty of fluids but you should avoid alcoholic beverages for 24 hours.  ACTIVITY:  You should plan to take it easy for the rest of today and you should NOT DRIVE  or use heavy machinery until tomorrow (because of the sedation medicines used during the test).    FOLLOW UP: Our staff will call the number listed on your records 48-72 hours following your procedure to check on you and address any questions or concerns that you may have regarding the information given to you following your procedure. If we do not reach you, we will leave a message.  We will attempt to reach you two times.  During this call, we will ask if you have developed any symptoms of COVID 19. If you develop any symptoms (ie: fever, flu-like symptoms, shortness of breath, cough etc.) before then, please call [phone].  If you test positive for Covid 19 in the 2 weeks post procedure, please call and report this information to us.    If any biopsies were taken you will be contacted by phone or by letter within the next 1-3 weeks.  Please call us at [phone] if you have not heard about the biopsies in 3 weeks.    SIGNATURES/CONFIDENTIALITY: You and/or your care partner have signed paperwork which will be entered into your electronic medical record.  These signatures attest to the fact that that the information above on your After Visit Summary has been reviewed and is understood.  Full responsibility of the confidentiality of this discharge information lies with you and/or your care-partner.  

## 2019-07-10 NOTE — Progress Notes (Signed)
Pt's states no medical or surgical changes since previsit or office visit. 

## 2019-07-10 NOTE — Op Note (Signed)
[place] Patient Name: Tonya Carlile Procedure Date: 07/10/2019 11:38 AM MRN: [id number] Endoscopist: Mauri Pole , MD Age: 69 Referring MD:  Date of Birth: 01/25/1950 Gender: Male Account #: [id number] Procedure:                Colonoscopy Indications:              High risk colon cancer surveillance: Personal                            history of colonic polyps, High risk colon cancer                            surveillance: Personal history of multiple (3 or                            more) adenomas Medicines:                Monitored Anesthesia Care Procedure:                Pre-Anesthesia Assessment:                           - Prior to the procedure, a History and Physical                            was performed, and patient medications and                            allergies were reviewed. The patient's tolerance of                            previous anesthesia was also reviewed. The risks                            and benefits of the procedure and the sedation                            options and risks were discussed with the patient.                            All questions were answered, and informed consent                            was obtained. Prior Anticoagulants: The patient has                            taken no previous anticoagulant or antiplatelet                            agents. ASA Grade Assessment: II - A patient with                            mild systemic disease. After reviewing the risks  and benefits, the patient was deemed in                            satisfactory condition to undergo the procedure.                           After obtaining informed consent, the colonoscope                            was passed under direct vision. Throughout the                            procedure, the patient's blood pressure, pulse, and                            oxygen saturations were monitored continuously.  The                            Colonoscope was introduced through the anus and                            advanced to the the cecum, identified by                            appendiceal orifice and ileocecal valve. The                            colonoscopy was performed without difficulty. The                            patient tolerated the procedure well. The quality                            of the bowel preparation was adequate. The                            ileocecal valve, appendiceal orifice, and rectum                            were photographed. Scope In: 11:47:21 AM Scope Out: 12:05:48 PM Scope Withdrawal Time: 0 hours 14 minutes 37 seconds  Total Procedure Duration: 0 hours 18 minutes 27 seconds  Findings:                 The perianal and digital rectal examinations were                            normal.                           Two sessile polyps were found in the ascending                            colon. The polyps were 1 to 2 mm in size. These  polyps were removed with a cold biopsy forceps.                            Resection and retrieval were complete.                           Seven sessile polyps were found in the transverse                            colon and ascending colon. The polyps were 3 to 5                            mm in size. These polyps were removed with a cold                            snare. Resection and retrieval were complete.                           A few small-mouthed diverticula were found in the                            sigmoid colon.                           Non-bleeding internal hemorrhoids were found during                            retroflexion. The hemorrhoids were small.                           The exam was otherwise without abnormality. Complications:            No immediate complications. Estimated Blood Loss:     Estimated blood loss was minimal. Impression:               - Two 1 to 2 mm  polyps in the ascending colon,                            removed with a cold biopsy forceps. Resected and                            retrieved.                           - Seven 3 to 5 mm polyps in the transverse colon                            and in the ascending colon, removed with a cold                            snare. Resected and retrieved.                           - Diverticulosis in the sigmoid colon.                           -  Non-bleeding internal hemorrhoids.                           - The examination was otherwise normal. Recommendation:           - Patient has a contact number available for                            emergencies. The signs and symptoms of potential                            delayed complications were discussed with the                            patient. Return to normal activities tomorrow.                            Written discharge instructions were provided to the                            patient.                           - Resume previous diet.                           - Continue present medications.                           - Await pathology results.                           - Repeat colonoscopy in 3 years for surveillance                            based on pathology results. Mauri Pole, MD 07/10/2019 12:14:55 PM This report has been signed electronically.

## 2019-07-12 ENCOUNTER — Telehealth: Payer: Self-pay

## 2019-07-12 NOTE — Telephone Encounter (Signed)
  Follow up Call-  Call back number 07/10/2019  Post procedure Call Back phone  # [phone]  Permission to leave phone message Yes  Some recent data might be hidden     Patient questions:  Do you have a fever, pain , or abdominal swelling? No. Pain Score  0 *  Have you tolerated food without any problems? Yes.    Have you been able to return to your normal activities? Yes.    Do you have any questions about your discharge instructions: Diet   No. Medications  No. Follow up visit  No.  Do you have questions or concerns about your Care? No.  Actions: * If pain score is 4 or above: No action needed, pain <4.  1. Have you developed a fever since your procedure?no  2.   Have you had an respiratory symptoms (SOB or cough) since your procedure? no  3.   Have you tested positive for COVID 19 since your procedure no  4.   Have you had any family members/close contacts diagnosed with the COVID 19 since your procedure?  no   If yes to any of these questions please route to Joylene John, RN and Erenest Rasher, RN

## 2019-07-19 ENCOUNTER — Encounter: Payer: Self-pay | Admitting: Gastroenterology

## 2019-08-05 ENCOUNTER — Other Ambulatory Visit (INDEPENDENT_AMBULATORY_CARE_PROVIDER_SITE_OTHER): Payer: Self-pay | Admitting: Internal Medicine

## 2019-08-05 DIAGNOSIS — I10 Essential (primary) hypertension: Secondary | ICD-10-CM

## 2019-08-09 DIAGNOSIS — Z20828 Contact with and (suspected) exposure to other viral communicable diseases: Secondary | ICD-10-CM | POA: Diagnosis not present

## 2019-08-10 ENCOUNTER — Encounter (INDEPENDENT_AMBULATORY_CARE_PROVIDER_SITE_OTHER): Payer: Self-pay | Admitting: Internal Medicine

## 2019-08-12 ENCOUNTER — Encounter (INDEPENDENT_AMBULATORY_CARE_PROVIDER_SITE_OTHER): Payer: Self-pay | Admitting: Internal Medicine

## 2019-08-13 ENCOUNTER — Other Ambulatory Visit (INDEPENDENT_AMBULATORY_CARE_PROVIDER_SITE_OTHER): Payer: Self-pay | Admitting: Internal Medicine

## 2019-08-13 ENCOUNTER — Ambulatory Visit: Payer: Medicare HMO

## 2019-08-13 MED ORDER — TAMSULOSIN HCL 0.4 MG PO CAPS: ORAL_CAPSULE | ORAL | 1 refills | Status: DC

## 2019-08-28 ENCOUNTER — Encounter (INDEPENDENT_AMBULATORY_CARE_PROVIDER_SITE_OTHER): Payer: Self-pay | Admitting: Internal Medicine

## 2019-08-28 ENCOUNTER — Ambulatory Visit (INDEPENDENT_AMBULATORY_CARE_PROVIDER_SITE_OTHER): Payer: Medicare HMO | Admitting: Internal Medicine

## 2019-08-28 ENCOUNTER — Telehealth (INDEPENDENT_AMBULATORY_CARE_PROVIDER_SITE_OTHER): Payer: Self-pay

## 2019-08-28 ENCOUNTER — Other Ambulatory Visit (INDEPENDENT_AMBULATORY_CARE_PROVIDER_SITE_OTHER): Payer: Self-pay | Admitting: Internal Medicine

## 2019-08-28 ENCOUNTER — Other Ambulatory Visit: Payer: Self-pay

## 2019-08-28 VITALS — BP 120/80 | HR 71 | Temp 98.1°F | Ht 69.0 in | Wt 213.6 lb

## 2019-08-28 DIAGNOSIS — R1012 Left upper quadrant pain: Secondary | ICD-10-CM

## 2019-08-28 DIAGNOSIS — I10 Essential (primary) hypertension: Secondary | ICD-10-CM | POA: Diagnosis not present

## 2019-08-28 DIAGNOSIS — E782 Mixed hyperlipidemia: Secondary | ICD-10-CM

## 2019-08-28 DIAGNOSIS — D696 Thrombocytopenia, unspecified: Secondary | ICD-10-CM

## 2019-08-28 DIAGNOSIS — E291 Testicular hypofunction: Secondary | ICD-10-CM

## 2019-08-28 DIAGNOSIS — H9193 Unspecified hearing loss, bilateral: Secondary | ICD-10-CM

## 2019-08-28 MED ORDER — TADALAFIL 5 MG PO TABS: 5.0000 mg | ORAL_TABLET | Freq: Every day | ORAL | 1 refills | Status: DC

## 2019-08-28 NOTE — Telephone Encounter (Signed)
Derald is asking if Dr. Anastasio Champion would put in a referral for him to see an Audiologist, please advise?

## 2019-08-28 NOTE — Telephone Encounter (Signed)
Was tested 6 yrs ago, now wife notice he is not hearing her more lately. So he wanted to be tested. He was told that he has some loss due to work & age.

## 2019-08-28 NOTE — Telephone Encounter (Signed)
Ok thank you, will look into.

## 2019-08-28 NOTE — Telephone Encounter (Signed)
Sent ref pend to Dr Darnell Level

## 2019-08-28 NOTE — Telephone Encounter (Signed)
Can you find out from the patient why he would like to see an audiologist?  Is it due to hearing loss?  Once I know the onset of this, I can put in the order.  Thanks.

## 2019-08-28 NOTE — Telephone Encounter (Signed)
Okay, I have sent the order in for audiology consultation.  I'm not sure what the name of this practice is but it seems to be within the [facility] health system.

## 2019-08-28 NOTE — Progress Notes (Signed)
Metrics: Intervention Frequency ACO  Documented Smoking Status Yearly  Screened one or more times in 24 months  Cessation Counseling or  Active cessation medication Past 24 months  Past 24 months   Guideline developer: UpToDate (See UpToDate for funding source) Date Released: 2014       Wellness Office Visit  Subjective:  Patient ID: Frank Noble, male    DOB: 1951-01-06  Age: 69 y.o. MRN: [id number]  CC: This man comes in for follow-up of hypertension, hypogonadism, hyperlipidemia. HPI He still continues to have left upper quadrant abdominal pain.  Ultrasound was negative except for fatty liver.  Colonoscopy shows benign polyps.  He says that the pain is usually worse in the morning and gets better towards the end of the day and has no problems in the night.  He rates it at a 1 out of 10. He continues to take amlodipine for hypertension. He continues to take statin therapy for hyperlipidemia and his numbers were in a good range last time. He continues to take testosterone therapy which has been beneficial for him.  Last time I checked his CBC, his platelet count was reduced.  Past Medical History:  Diagnosis Date  . Allergy   . Arthritis    back  . BPH (benign prostatic hyperplasia) 11/28/2018  . Cataract   . Colon adenomas 03/2016   5, polyps  . GERD (gastroesophageal reflux disease)   . Heart murmur   . HLD (hyperlipidemia) 11/28/2018  . Hypercholesteremia   . Hypertension   . 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 Sleep apnea    wears CPAP  . Thyroid disease    hypothyroid  . Vitamin D deficiency disease 11/28/2018   Past Surgical History:  Procedure Laterality Date  . COLONOSCOPY N/A 04/05/2016   Procedure: COLONOSCOPY;  Surgeon: [name], MD;  Location: [facility];  Service: Endoscopy;  Laterality: N/A;  8:30 Am  . COLONOSCOPY  07/10/2019   2018  . TONSILLECTOMY       Family History  Problem Relation Age of Onset  . Breast cancer Mother         Mets to liver  . High blood pressure Father   . Aneurysm Father   . Esophageal cancer Maternal Grandmother   . Colon cancer Neg Hx   . Rectal cancer Neg Hx   . Stomach cancer Neg Hx     Social History   Social History Narrative         Married for 41 years.Lives with wife.Retired since Dec 2020..Originally from [place],in [place] years.    Social History   Tobacco Use  . Smoking status: Former Smoker    Packs/day: 0.50    Years: 10.00    Pack years: 5.00    Types: Cigarettes  . Smokeless tobacco: Never Used  . Tobacco comment: Quit age 35yrs  Substance Use Topics  . Alcohol use: Yes    Alcohol/week: 8.0 standard drinks    Types: 7 Cans of beer, 1 Shots of liquor per week    Comment: everyday, at least one beer a day    Current Meds  Medication Sig  . amLODipine (NORVASC) 5 MG tablet TAKE 1 TABLET BY MOUTH EVERY DAY  . atorvastatin (LIPITOR) 20 MG tablet TAKE 1 TABLET BY MOUTH EVERY DAY  . Cetirizine HCl (ZYRTEC PO) Take by mouth as needed.  . Cholecalciferol (VITAMIN D-3) 5000 units TABS Take 10,000 Units by mouth daily.   . Needles & Syringes MISC  1 mm by Does not apply route as directed.  . Omeprazole 20 MG TBDD Take 20 mg by mouth daily.  . Syringe, Disposable, (EASY GLIDE LUER LOCK SYRINGE) 1 ML MISC 1 Syringe by Does not apply route once a week.  . tadalafil (CIALIS) 5 MG tablet Take 1 tablet (5 mg total) by mouth daily.  . tamsulosin (FLOMAX) 0.4 MG CAPS capsule TAKE 1 CAPSULE DAILY (NEW)  . testosterone cypionate (DEPOTESTOSTERONE CYPIONATE) 200 MG/ML injection INJECT 0.5 MLS (100 MG TOTAL) INTO THE MUSCLE EVERY 7 (SEVEN) DAYS. INJECTS 0.5ML ON SUNDAYS.  Marland Kitchen thyroid (NP THYROID) 60 MG tablet Take 1 tablet (60 mg total) by mouth daily before breakfast.  . [DISCONTINUED] tadalafil (CIALIS) 5 MG tablet TAKE 1 TABLET DAILY (Patient taking differently: Takes for BPH)   Current Facility-Administered Medications for the 08/28/19 encounter (Office Visit) with Doree Albee, MD  Medication  . 0.9 %  sodium chloride infusion      Depression screen [facility] 2/9 11/28/2018  Decreased Interest 0  Down, Depressed, Hopeless 0  PHQ - 2 Score 0     Objective:   Today's Vitals: BP 120/80 (BP Location: Left Arm, Patient Position: Sitting, Cuff Size: Normal)   Pulse 71   Temp 98.1 F (36.7 C) (Temporal)   Ht 5\' 9"  (1.753 m)   Wt 213 lb 9.6 oz (96.9 kg)   SpO2 96%   BMI 31.54 kg/m  Vitals with BMI 08/28/2019 07/10/2019 07/10/2019  Height 5\' 9"  - -  Weight 213 lbs 10 oz - -  BMI 63.87 - -  Systolic 564 332 951  Diastolic 80 80 74  Pulse 71 51 54     Physical Exam  Looks systemically well.  He remains obese.  His weight is largely unchanged.  Blood pressure is in good control.     Assessment   1. Essential hypertension, benign   2. Mixed hyperlipidemia   3. Primary testicular failure   4. Thrombocytopenia ([place])   5. LUQ pain       Tests ordered Orders Placed This Encounter  Procedures  . CBC  . COMPLETE METABOLIC PANEL WITH GFR  . Sedimentation Rate     Plan: 1. I am not sure as to the cause of the left upper quadrant abdominal pain and we will check a sed rate to make sure there are no other inflammation going on that we are not aware of.  We will continue to monitor this and we may need to investigate more aggressively if necessary. 2. His hypertension is well controlled and he will continue with amlodipine. 3. He will continue with testosterone therapy and his levels are in a good range. 4. He will also continue with statin therapy for his hyperlipidemia as before. 5. I will check a CBC again to look at his thrombocytopenia. 6. Further recommendations will depend on blood results and I will see him in about 6 months time for an annual physical exam.   Meds ordered this encounter  Medications  . tadalafil (CIALIS) 5 MG tablet    Sig: Take 1 tablet (5 mg total) by mouth daily.    Dispense:  90 tablet    Refill:  1     Lindi Abram Luther Parody, MD

## 2019-08-29 LAB — COMPLETE METABOLIC PANEL WITH GFR
AG Ratio: 2.5 (calc) (ref 1.0–2.5)
ALT: 35 U/L (ref 9–46)
AST: 28 U/L (ref 10–35)
Albumin: 4.3 g/dL (ref 3.6–5.1)
Alkaline phosphatase (APISO): 68 U/L (ref 35–144)
BUN/Creatinine Ratio: 12 (calc) (ref 6–22)
BUN: 16 mg/dL (ref 7–25)
CO2: 29 mmol/L (ref 20–32)
Calcium: 8.8 mg/dL (ref 8.6–10.3)
Chloride: 105 mmol/L (ref 98–110)
Creat: 1.33 mg/dL — ABNORMAL HIGH (ref 0.70–1.25)
GFR, Est African American: 63 mL/min/{1.73_m2} (ref >=60)
GFR, Est Non African American: 55 mL/min/{1.73_m2} — ABNORMAL LOW (ref >=60)
Globulin: 1.7 g/dL (calc) — ABNORMAL LOW (ref 1.9–3.7)
Glucose, Bld: 88 mg/dL (ref 65–99)
Potassium: 4.3 mmol/L (ref 3.5–5.3)
Sodium: 141 mmol/L (ref 135–146)
Total Bilirubin: 1 mg/dL (ref 0.2–1.2)
Total Protein: 6 g/dL — ABNORMAL LOW (ref 6.1–8.1)

## 2019-08-29 LAB — CBC
HCT: 55.2 % — ABNORMAL HIGH (ref 38.5–50.0)
Hemoglobin: 19 g/dL — ABNORMAL HIGH (ref 13.2–17.1)
MCH: 33.1 pg — ABNORMAL HIGH (ref 27.0–33.0)
MCHC: 34.4 g/dL (ref 32.0–36.0)
MCV: 96.2 fL (ref 80.0–100.0)
MPV: 11.1 fL (ref 7.5–12.5)
Platelets: 128 10*3/uL — ABNORMAL LOW (ref 140–400)
RBC: 5.74 10*6/uL (ref 4.20–5.80)
RDW: 12.4 % (ref 11.0–15.0)
WBC: 4.5 10*3/uL (ref 3.8–10.8)

## 2019-08-29 LAB — SEDIMENTATION RATE: Sed Rate: 2 mm/h (ref 0–20)

## 2019-08-31 ENCOUNTER — Encounter (INDEPENDENT_AMBULATORY_CARE_PROVIDER_SITE_OTHER): Payer: Self-pay | Admitting: Internal Medicine

## 2019-09-03 ENCOUNTER — Other Ambulatory Visit (INDEPENDENT_AMBULATORY_CARE_PROVIDER_SITE_OTHER): Payer: Self-pay | Admitting: Internal Medicine

## 2019-09-03 ENCOUNTER — Other Ambulatory Visit (INDEPENDENT_AMBULATORY_CARE_PROVIDER_SITE_OTHER): Payer: Self-pay | Admitting: Nurse Practitioner

## 2019-09-03 NOTE — Telephone Encounter (Signed)
Can you answer this concern for him? Thank you

## 2019-09-04 ENCOUNTER — Ambulatory Visit: Payer: Medicare HMO | Attending: Audiologist | Admitting: Audiologist

## 2019-09-04 ENCOUNTER — Other Ambulatory Visit: Payer: Self-pay

## 2019-09-04 ENCOUNTER — Other Ambulatory Visit (INDEPENDENT_AMBULATORY_CARE_PROVIDER_SITE_OTHER): Payer: Self-pay | Admitting: Nurse Practitioner

## 2019-09-04 DIAGNOSIS — H906 Mixed conductive and sensorineural hearing loss, bilateral: Secondary | ICD-10-CM | POA: Insufficient documentation

## 2019-09-04 DIAGNOSIS — D696 Thrombocytopenia, unspecified: Secondary | ICD-10-CM

## 2019-09-04 NOTE — Procedures (Signed)
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  History: Reinhard was seen for an audiological evaluation.  Darsh is receiving a hearing evaluation due to concerns for hearing loss due to increased difficulty hearing in noise. Cassell has difficulty hearing in background noise, crowds, and when people are at a distance. This difficulty began gradually. No pain or pressure reported in either ear. No constant tinnitus present in either ear. Arvle has a history of noise exposure from working at [employer] but participated in their hearing protection protocols. He says that 6 years ago he had a hearing test through work and they said he has hearing loss. His wife says that he struggles to hear. He does not feel there is any difference between the ears.  Medical history negative for any risk factor for hearing loss. No other relevant case history reported.   Evaluation:   Otoscopy showed a clear view of the tympanic membranes, bilaterally  Tympanometry results were consistent with normal middle ear function, bilaterally    Audiometric testing was completed using conventional audiometry with insert transducer. Speech Recognition Thresholds were consistent with pure tone averages. Word Recognition was excellent at an elevated level. Pure tone thresholds show normal sloping to moderate sensorineural hearing loss in the left ear and mild sensorineural hearing loss in the right ear. A slight conductive component noted at 500 Hz only in both ears. Test results are consistent with a significant asymmetry at 4k  and 6k Hz with the left ear worse.   Quicksin shows: moderate SNR loss when presented to the left ear only, normal SNR in the right ear only, and mild SNR loss when presented bilaterally.   Results:  The test results were reviewed with York Cerise.  Despite not perceiving a difference in his hearing the results of testing today show that the left ear has worse hearing than the right. The difference is not likely caused by wax or infection as the middle ear function is normal for that ear. It is recommended that York Cerise see an ENT Physician to rule out any possible medical cause for the asymmetric hearing loss. His ability to hear in noise is 8dB different between the ears. He is a borderline candidate for a hearing aid in the left ear pending medical clearance.  Hamish reported understanding. He was provided with copies of his hearing test.   Recommendations: 1. Amplification could be beneficial in the left ear only. Hearing aids can be purchased from a variety of locations pending medical clearance.  2. Referral to ENT Physician necessary due to asymmetric hearing loss.    Alfonse Alpers  Audiologist, Au.D., CCC-A 09/04/2019  3:54 PM  Cc: Doree Albee, MD

## 2019-09-17 ENCOUNTER — Encounter (INDEPENDENT_AMBULATORY_CARE_PROVIDER_SITE_OTHER): Payer: Self-pay | Admitting: Internal Medicine

## 2019-09-17 ENCOUNTER — Other Ambulatory Visit (INDEPENDENT_AMBULATORY_CARE_PROVIDER_SITE_OTHER): Payer: Self-pay | Admitting: Internal Medicine

## 2019-09-17 DIAGNOSIS — H9193 Unspecified hearing loss, bilateral: Secondary | ICD-10-CM

## 2019-09-17 NOTE — Telephone Encounter (Signed)
Can you make this a ENT instead? Per my chart message.

## 2019-09-20 ENCOUNTER — Ambulatory Visit (HOSPITAL_COMMUNITY): Payer: Medicare HMO | Admitting: Hematology

## 2019-09-26 ENCOUNTER — Inpatient Hospital Stay (HOSPITAL_COMMUNITY): Payer: Medicare HMO | Attending: Hematology | Admitting: Hematology

## 2019-09-26 ENCOUNTER — Other Ambulatory Visit: Payer: Self-pay

## 2019-09-26 ENCOUNTER — Encounter (HOSPITAL_COMMUNITY): Payer: Self-pay | Admitting: Hematology

## 2019-09-26 ENCOUNTER — Inpatient Hospital Stay (HOSPITAL_COMMUNITY): Payer: Medicare HMO

## 2019-09-26 VITALS — BP 142/84 | HR 71 | Temp 97.1°F | Resp 18 | Ht 69.0 in | Wt 212.9 lb

## 2019-09-26 DIAGNOSIS — Z803 Family history of malignant neoplasm of breast: Secondary | ICD-10-CM | POA: Diagnosis not present

## 2019-09-26 DIAGNOSIS — D751 Secondary polycythemia: Secondary | ICD-10-CM | POA: Insufficient documentation

## 2019-09-26 DIAGNOSIS — Z853 Personal history of malignant neoplasm of breast: Secondary | ICD-10-CM | POA: Insufficient documentation

## 2019-09-26 DIAGNOSIS — Z800 Family history of malignant neoplasm of digestive organs: Secondary | ICD-10-CM | POA: Diagnosis not present

## 2019-09-26 DIAGNOSIS — Z87891 Personal history of nicotine dependence: Secondary | ICD-10-CM | POA: Insufficient documentation

## 2019-09-26 DIAGNOSIS — D696 Thrombocytopenia, unspecified: Secondary | ICD-10-CM | POA: Insufficient documentation

## 2019-09-26 LAB — CBC WITH DIFFERENTIAL/PLATELET
Abs Immature Granulocytes: 0.01 10*3/uL (ref 0.00–0.07)
Basophils Absolute: 0 10*3/uL (ref 0.0–0.1)
Basophils Relative: 1 %
Eosinophils Absolute: 0.1 10*3/uL (ref 0.0–0.5)
Eosinophils Relative: 2 %
HCT: 55.8 % — ABNORMAL HIGH (ref 39.0–52.0)
Hemoglobin: 19.1 g/dL — ABNORMAL HIGH (ref 13.0–17.0)
Immature Granulocytes: 0 %
Lymphocytes Relative: 30 %
Lymphs Abs: 1.2 10*3/uL (ref 0.7–4.0)
MCH: 33.1 pg (ref 26.0–34.0)
MCHC: 34.2 g/dL (ref 30.0–36.0)
MCV: 96.7 fL (ref 80.0–100.0)
Monocytes Absolute: 0.4 10*3/uL (ref 0.1–1.0)
Monocytes Relative: 10 %
Neutro Abs: 2.3 10*3/uL (ref 1.7–7.7)
Neutrophils Relative %: 57 %
Platelets: 137 10*3/uL — ABNORMAL LOW (ref 150–400)
RBC: 5.77 MIL/uL (ref 4.22–5.81)
RDW: 12.6 % (ref 11.5–15.5)
WBC: 4 10*3/uL (ref 4.0–10.5)
nRBC: 0 % (ref 0.0–0.2)

## 2019-09-26 LAB — LACTATE DEHYDROGENASE: LDH: 128 U/L (ref 98–192)

## 2019-09-26 LAB — HEPATITIS B CORE ANTIBODY, IGM: Hep B C IgM: NONREACTIVE

## 2019-09-26 LAB — HEPATITIS B SURFACE ANTIGEN: Hepatitis B Surface Ag: NONREACTIVE

## 2019-09-26 LAB — VITAMIN B12: Vitamin B-12: 199 pg/mL (ref 180–914)

## 2019-09-26 LAB — HEPATITIS B SURFACE ANTIBODY,QUALITATIVE: Hep B S Ab: NONREACTIVE

## 2019-09-26 LAB — VITAMIN D 25 HYDROXY (VIT D DEFICIENCY, FRACTURES): Vit D, 25-Hydroxy: 75.85 ng/mL (ref 30–100)

## 2019-09-26 LAB — FOLATE: Folate: 8.5 ng/mL (ref >5.9)

## 2019-09-26 LAB — PLATELET BY CITRATE

## 2019-09-26 NOTE — Progress Notes (Signed)
CONSULT NOTE  Patient Care Team: Doree Albee, MD as PCP - General (Internal Medicine)  CHIEF COMPLAINTS/PURPOSE OF CONSULTATION: Thrombocytopenia and erythrocytosis  HISTORY OF PRESENTING ILLNESS:  Frank Noble 69 y.o. male was sent here by his PCP for thrombocytopenia and erythrocytosis.  Patient has had known erythrocytosis due to his testosterone use for the past 3 years.  Patient was recently diagnosed with thrombocytopenia.  Patient reports his recent colonoscopy in June 2021 had several polyps which came back normal.  He also had an abdominal ultrasound on 04/2019 which showed spleen was normal and fatty infiltrated liver.  Patient denies any recent bleeding such as epistasis, hematuria or hematochezia.  He does report his gums bleed from time to time when he brushes his teeth.  Patient denies any smoking or illicit drug use.  He does report having 1 beer daily.  Patient reports he is used a CPAP machine for 10 years now.  He denies a history of blood transfusions.  He denies any steroid use.  He denies any tick bites recently.  He denies any pica and eats a variety of diet.  He denies any B symptoms including fevers, chills, night sweats and unexplained weight loss.  He has had no prior history or diagnosis of cancer.  His age appropriate screenings are up-to-date.  Patient reports he lives at home and performs all his own ADLs.  Patient has a positive family history for a mother with breast cancer.  And a maternal grandmother he had esophageal cancer and was a smoker.   MEDICAL HISTORY:  Past Medical History:  Diagnosis Date  . Allergy   . Arthritis    back  . BPH (benign prostatic hyperplasia) 11/28/2018  . Cataract   . Colon adenomas 03/2016   5, polyps  . GERD (gastroesophageal reflux disease)   . Heart murmur   . HLD (hyperlipidemia) 11/28/2018  . Hypercholesteremia   . Hypertension   . 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 Sleep apnea    wears CPAP  . Thyroid  disease    hypothyroid  . Vitamin D deficiency disease 11/28/2018    SURGICAL HISTORY: Past Surgical History:  Procedure Laterality Date  . COLONOSCOPY N/A 04/05/2016   Procedure: COLONOSCOPY;  Surgeon: [name], MD;  Location: [facility];  Service: Endoscopy;  Laterality: N/A;  8:30 Am  . COLONOSCOPY  07/10/2019   2018  . TONSILLECTOMY      SOCIAL HISTORY: Social History   Socioeconomic History  . Marital status: Married    Spouse name: Not on file  . Number of children: 3  . Years of education: Not on file  . Highest education level: Not on file  Occupational History  . Occupation: retired  Tobacco Use  . Smoking status: Former Smoker    Packs/day: 0.50    Years: 10.00    Pack years: 5.00    Types: Cigarettes  . Smokeless tobacco: Never Used  . Tobacco comment: Quit age 5yrs  Vaping Use  . Vaping Use: Never used  Substance and Sexual Activity  . Alcohol use: Yes    Alcohol/week: 8.0 standard drinks    Types: 7 Cans of beer, 1 Shots of liquor per week    Comment: everyday, at least one beer a day  . Drug use: No  . Sexual activity: Not on file  Other Topics Concern  . Not on file  Social History Narrative         Married for 41  years.Lives with wife.Retired since Dec 2020..Originally from [place],in [place] years.    Social Determinants of Health   Financial Resource Strain:   . Difficulty of Paying Living Expenses: Not on file  Food Insecurity:   . Worried About Charity fundraiser in the Last Year: Not on file  . Ran Out of Food in the Last Year: Not on file  Transportation Needs:   . Lack of Transportation (Medical): Not on file  . Lack of Transportation (Non-Medical): Not on file  Physical Activity:   . Days of Exercise per Week: Not on file  . Minutes of Exercise per Session: Not on file  Stress:   . Feeling of Stress : Not on file  Social Connections:   . Frequency of Communication with Friends and Family: Not on file  . Frequency  of Social Gatherings with Friends and Family: Not on file  . Attends Religious Services: Not on file  . Active Member of Clubs or Organizations: Not on file  . Attends Archivist Meetings: Not on file  . Marital Status: Not on file  Intimate Partner Violence:   . Fear of Current or Ex-Partner: Not on file  . Emotionally Abused: Not on file  . Physically Abused: Not on file  . Sexually Abused: Not on file    FAMILY HISTORY: Family History  Problem Relation Age of Onset  . Breast cancer Mother        Mets to liver  . High blood pressure Father   . Aneurysm Father   . Esophageal cancer Maternal Grandmother   . Colon cancer Neg Hx   . Rectal cancer Neg Hx   . Stomach cancer Neg Hx     ALLERGIES:  has No Known Allergies.  MEDICATIONS:  Current Outpatient Medications  Medication Sig Dispense Refill  . amLODipine (NORVASC) 5 MG tablet TAKE 1 TABLET BY MOUTH EVERY DAY 30 tablet 3  . atorvastatin (LIPITOR) 20 MG tablet TAKE 1 TABLET EVERY DAY 90 tablet 0  . Cetirizine HCl (ZYRTEC PO) Take by mouth as needed.    . Cholecalciferol (VITAMIN D-3) 5000 units TABS Take 10,000 Units by mouth daily.     . Needles & Syringes MISC 1 mm by Does not apply route as directed. 50 Syringe 0  . omeprazole (PRILOSEC) 20 MG capsule     . Syringe, Disposable, (EASY GLIDE LUER LOCK SYRINGE) 1 ML MISC 1 Syringe by Does not apply route once a week. 100 each 0  . tadalafil (CIALIS) 5 MG tablet Take 1 tablet (5 mg total) by mouth daily. 90 tablet 1  . tamsulosin (FLOMAX) 0.4 MG CAPS capsule TAKE 1 CAPSULE DAILY (NEW) 90 capsule 1  . testosterone cypionate (DEPOTESTOSTERONE CYPIONATE) 200 MG/ML injection INJECT 0.5 MLS (100 MG TOTAL) INTO THE MUSCLE EVERY 7 (SEVEN) DAYS. INJECTS 0.5ML ON SUNDAYS. 10 mL 1  . thyroid (NP THYROID) 60 MG tablet Take 1 tablet (60 mg total) by mouth daily before breakfast. 90 tablet 1   Current Facility-Administered Medications  Medication Dose Route Frequency Provider  Last Rate Last Admin  . 0.9 %  sodium chloride infusion  500 mL Intravenous Once Nandigam, Venia Minks, MD        REVIEW OF SYSTEMS:   Constitutional: Denies fevers, chills or abnormal night sweats Respiratory: Denies cough, dyspnea or wheezes Cardiovascular: Denies palpitation, chest discomfort or lower extremity swelling Gastrointestinal:  Denies nausea, heartburn or change in bowel habits, + constipation Skin: Denies abnormal skin  rashes Lymphatics: Denies new lymphadenopathy or easy bruising Neurological:Denies numbness, tingling or new weaknesses Behavioral/Psych: Mood is stable, no new changes , + sleep disturbances All other systems were reviewed with the patient and are negative.  PHYSICAL EXAMINATION: ECOG PERFORMANCE STATUS: 0 - Asymptomatic  Vitals:   09/26/19 1016  BP: (!) 142/84  Pulse: 71  Resp: 18  Temp: (!) 97.1 F (36.2 C)  SpO2: 99%   Filed Weights   09/26/19 1016  Weight: 212 lb 14.4 oz (96.6 kg)    GENERAL:alert, no distress and comfortable SKIN: skin color, texture, turgor are normal, no rashes or significant lesions NECK: supple, thyroid normal size, non-tender, without nodularity LYMPH:  no palpable lymphadenopathy in the cervical, axillary or inguinal LUNGS: clear to auscultation and percussion with normal breathing effort HEART: regular rate & rhythm and no murmurs and no lower extremity edema ABDOMEN:abdomen soft, non-tender and normal bowel sounds Musculoskeletal:no cyanosis of digits and no clubbing  PSYCH: alert & oriented x 3 with fluent speech NEURO: no focal motor/sensory deficits  LABORATORY DATA:  I have reviewed the data as listed Recent Results (from the past 2160 hour(s))  CBC     Status: Abnormal   Collection Time: 08/28/19 10:35 AM  Result Value Ref Range   WBC 4.5 3.8 - 10.8 Thousand/uL   RBC 5.74 4.20 - 5.80 Million/uL   Hemoglobin 19.0 (H) 13.2 - 17.1 g/dL    Comment: Verified by repeat analysis. Marland Kitchen    HCT 55.2 (H) 38 - 50  %   MCV 96.2 80.0 - 100.0 fL   MCH 33.1 (H) 27.0 - 33.0 pg   MCHC 34.4 32.0 - 36.0 g/dL   RDW 12.4 11.0 - 15.0 %   Platelets 128 (L) 140 - 400 Thousand/uL   MPV 11.1 7.5 - 12.5 fL  COMPLETE METABOLIC PANEL WITH GFR     Status: Abnormal   Collection Time: 08/28/19 10:35 AM  Result Value Ref Range   Glucose, Bld 88 65 - 99 mg/dL    Comment: .            Fasting reference interval .    BUN 16 7 - 25 mg/dL   Creat 1.33 (H) 0.70 - 1.25 mg/dL    Comment: For patients >25 years of age, the reference limit for Creatinine is approximately 13% higher for people identified as African-American. .    GFR, Est Non African American 55 (L) > OR = 60 mL/min/1.60m2   GFR, Est African American 63 > OR = 60 mL/min/1.67m2   BUN/Creatinine Ratio 12 6 - 22 (calc)   Sodium 141 135 - 146 mmol/L   Potassium 4.3 3.5 - 5.3 mmol/L   Chloride 105 98 - 110 mmol/L   CO2 29 20 - 32 mmol/L   Calcium 8.8 8.6 - 10.3 mg/dL   Total Protein 6.0 (L) 6.1 - 8.1 g/dL   Albumin 4.3 3.6 - 5.1 g/dL   Globulin 1.7 (L) 1.9 - 3.7 g/dL (calc)   AG Ratio 2.5 1.0 - 2.5 (calc)   Total Bilirubin 1.0 0.2 - 1.2 mg/dL   Alkaline phosphatase (APISO) 68 35 - 144 U/L   AST 28 10 - 35 U/L   ALT 35 9 - 46 U/L  Sedimentation rate     Status: None   Collection Time: 08/28/19 10:35 AM  Result Value Ref Range   Sed Rate 2 0 - 20 mm/h  Platelet by Citrate     Status: None   Collection Time: 09/26/19  11:30 AM  Result Value Ref Range   Platelet CT in Citrate EDTA platelet count consistent with citrate.     Comment: Performed at [facility], [address], [place], [place] [address]  VITAMIN D 25 Hydroxy (Vit-D Deficiency, Fractures)     Status: None   Collection Time: 09/26/19 11:30 AM  Result Value Ref Range   Vit D, 25-Hydroxy 75.85 30 - 100 ng/mL    Comment: (NOTE) Vitamin D deficiency has been defined by the Institute of Medicine  and an Endocrine Society practice guideline as a level of serum 25-OH  vitamin D less than 20  ng/mL (1,2). The Endocrine Society went on to  further define vitamin D insufficiency as a level between 21 and 29  ng/mL (2).  1. IOM (Institute of Medicine). 2010. Dietary reference intakes for  calcium and D. [place]: The [employer]. 2. Holick MF, Binkley [place], Bischoff-Ferrari HA, et al. Evaluation,  treatment, and prevention of vitamin D deficiency: an Endocrine  Society clinical practice guideline, JCEM. 2011 Jul; 96(7): 1911-30.  Performed at [facility], [place] [address], [place], [place] [address]   Vitamin B12     Status: None   Collection Time: 09/26/19 11:30 AM  Result Value Ref Range   Vitamin B-12 199 180 - 914 pg/mL    Comment: (NOTE) This assay is not validated for testing neonatal or myeloproliferative syndrome specimens for Vitamin B12 levels. Performed at [facility], [address], [place], [place] [address]   Lactate dehydrogenase     Status: None   Collection Time: 09/26/19 11:30 AM  Result Value Ref Range   LDH 128 98 - 192 U/L    Comment: Performed at [facility], [address], [place], [place] [address]  Hepatitis B surface antigen     Status: None   Collection Time: 09/26/19 11:30 AM  Result Value Ref Range   Hepatitis B Surface Ag NON REACTIVE NON REACTIVE    Comment: Performed at [facility] [address] [place], [place] [address]  Hepatitis B surface antibody,qualitative     Status: None   Collection Time: 09/26/19 11:30 AM  Result Value Ref Range   Hep B S Ab NON REACTIVE NON REACTIVE    Comment: (NOTE) Inconsistent with immunity, less than 10 mIU/mL.  Performed at [facility], [place] [address], [place], [place] [address]   Hepatitis B core antibody, IgM     Status: None   Collection Time: 09/26/19 11:30 AM  Result Value Ref Range   Hep B C IgM NON REACTIVE NON REACTIVE    Comment: Performed at [facility], [place] [address], [place], [place] [address]  Folate     Status: None   Collection  Time: 09/26/19 11:30 AM  Result Value Ref Range   Folate 8.5 >5.9 ng/mL    Comment: Performed at [facility], [address], [place], [place] [address]  CBC with Differential/Platelet     Status: Abnormal   Collection Time: 09/26/19 11:30 AM  Result Value Ref Range   WBC 4.0 4.0 - 10.5 K/uL   RBC 5.77 4.22 - 5.81 MIL/uL   Hemoglobin 19.1 (H) 13.0 - 17.0 g/dL   HCT 55.8 (H) 39 - 52 %   MCV 96.7 80.0 - 100.0 fL   MCH 33.1 26.0 - 34.0 pg   MCHC 34.2 30.0 - 36.0 g/dL   RDW 12.6 11.5 - 15.5 %   Platelets 137 (L)  150 - 400 K/uL   nRBC 0.0 0.0 - 0.2 %   Neutrophils Relative % 57 %   Neutro Abs 2.3 1.7 - 7.7 K/uL   Lymphocytes Relative 30 %   Lymphs Abs 1.2 0.7 - 4.0 K/uL   Monocytes Relative 10 %   Monocytes Absolute 0.4 0 - 1 K/uL   Eosinophils Relative 2 %   Eosinophils Absolute 0.1 0 - 0 K/uL   Basophils Relative 1 %   Basophils Absolute 0.0 0 - 0 K/uL   Immature Granulocytes 0 %   Abs Immature Granulocytes 0.01 0.00 - 0.07 K/uL    Comment: Performed at [facility], [address], [place], [place] [address]    RADIOGRAPHIC STUDIES: I have personally reviewed the radiological images as listed and agreed with the findings in the report. I have independently interviewed and examined this patient.  I agree with HPI written by my nurse practitioner Wenda Low, FNP.  I have independently formulated my assessment and plan.  ASSESSMENT & PLAN:  1.  Mild thrombocytopenia: -CBC from 08/28/2019 shows platelet count of 128.  Prior to that platelet count was in the 130s in March of this year. -Denies any easy bruising or bleeding. -Ultrasound of the abdomen on 04/25/2019 shows normal spleen.  No focal liver lesions.  Echogenic liver. -Differential diagnosis includes immune mediated thrombocytopenia. -We will check for nutritional deficiencies, connective tissue disorders and infectious etiology.  We will check his platelet count and sodium citrate tube.  We will see him back in 2 to 3  weeks for follow-up.  2.  Erythrocytosis: -CBC on 08/28/2019 shows hemoglobin 19 and hematocrit of 55. -He has sleep apnea and uses CPAP for the last 10 years.  He is a non-smoker. -He is on testosterone supplements for the last 2 to 3 years. 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  However will check for myeloproliferative disorders with JAK2 [id number] testing.  We will also check serum erythropoietin level.     All questions were answered. The patient knows to call the clinic with any problems, questions or concerns.     Derek Jack, MD 09/26/19 6:39 PM

## 2019-09-27 LAB — RHEUMATOID FACTOR: Rheumatoid fact SerPl-aCnc: <10 [IU]/mL (ref 0.0–13.9)

## 2019-09-30 LAB — METHYLMALONIC ACID, SERUM

## 2019-09-30 LAB — JAK2 GENOTYPR

## 2019-09-30 LAB — ANTINUCLEAR ANTIBODIES, IFA

## 2019-09-30 LAB — ERYTHROPOIETIN

## 2019-10-01 ENCOUNTER — Inpatient Hospital Stay (HOSPITAL_COMMUNITY): Payer: Medicare HMO

## 2019-10-01 ENCOUNTER — Other Ambulatory Visit: Payer: Self-pay

## 2019-10-01 ENCOUNTER — Other Ambulatory Visit (HOSPITAL_COMMUNITY): Payer: Self-pay | Admitting: *Deleted

## 2019-10-01 ENCOUNTER — Encounter (HOSPITAL_COMMUNITY): Payer: Self-pay | Admitting: Nurse Practitioner

## 2019-10-01 DIAGNOSIS — D696 Thrombocytopenia, unspecified: Secondary | ICD-10-CM

## 2019-10-01 DIAGNOSIS — D751 Secondary polycythemia: Secondary | ICD-10-CM | POA: Diagnosis not present

## 2019-10-01 DIAGNOSIS — Z87891 Personal history of nicotine dependence: Secondary | ICD-10-CM | POA: Diagnosis not present

## 2019-10-01 DIAGNOSIS — Z853 Personal history of malignant neoplasm of breast: Secondary | ICD-10-CM | POA: Diagnosis not present

## 2019-10-01 DIAGNOSIS — Z800 Family history of malignant neoplasm of digestive organs: Secondary | ICD-10-CM | POA: Diagnosis not present

## 2019-10-01 DIAGNOSIS — Z803 Family history of malignant neoplasm of breast: Secondary | ICD-10-CM | POA: Diagnosis not present

## 2019-10-01 NOTE — Progress Notes (Signed)
Received notification from [facility] that they did not have enough blood from last weeks labs to run the patient's erythropoietin, ANA/IFE with reflex, and MMA.  New orders placed and patient notified that additional labs needed.

## 2019-10-02 LAB — COPPER, SERUM: Copper: 85 ug/dL (ref 69–132)

## 2019-10-02 LAB — ERYTHROPOIETIN: Erythropoietin: 20.8 m[IU]/mL — ABNORMAL HIGH (ref 2.6–18.5)

## 2019-10-02 LAB — H PYLORI, IGM, IGG, IGA AB
H Pylori IgG: 0.08 Index Value (ref 0.00–0.79)
H. Pylogi, Iga Abs: <9 units (ref 0.0–8.9)
H. Pylogi, Igm Abs: <9 units (ref 0.0–8.9)

## 2019-10-02 LAB — ANTINUCLEAR ANTIBODIES, IFA: ANA Ab, IFA: NEGATIVE

## 2019-10-05 ENCOUNTER — Encounter (HOSPITAL_COMMUNITY): Payer: Self-pay | Admitting: Nurse Practitioner

## 2019-10-05 LAB — METHYLMALONIC ACID, SERUM: Methylmalonic Acid, Quantitative: 380 nmol/L — ABNORMAL HIGH (ref 0–378)

## 2019-10-16 ENCOUNTER — Other Ambulatory Visit: Payer: Self-pay

## 2019-10-16 ENCOUNTER — Inpatient Hospital Stay (HOSPITAL_BASED_OUTPATIENT_CLINIC_OR_DEPARTMENT_OTHER): Payer: Medicare HMO | Admitting: Hematology

## 2019-10-16 VITALS — BP 143/80 | HR 65 | Temp 97.1°F | Resp 18 | Wt 215.1 lb

## 2019-10-16 DIAGNOSIS — Z803 Family history of malignant neoplasm of breast: Secondary | ICD-10-CM | POA: Diagnosis not present

## 2019-10-16 DIAGNOSIS — D696 Thrombocytopenia, unspecified: Secondary | ICD-10-CM | POA: Diagnosis not present

## 2019-10-16 DIAGNOSIS — Z800 Family history of malignant neoplasm of digestive organs: Secondary | ICD-10-CM | POA: Diagnosis not present

## 2019-10-16 DIAGNOSIS — D751 Secondary polycythemia: Secondary | ICD-10-CM | POA: Diagnosis not present

## 2019-10-16 DIAGNOSIS — Z853 Personal history of malignant neoplasm of breast: Secondary | ICD-10-CM | POA: Diagnosis not present

## 2019-10-16 DIAGNOSIS — Z87891 Personal history of nicotine dependence: Secondary | ICD-10-CM | POA: Diagnosis not present

## 2019-10-16 NOTE — Patient Instructions (Signed)
[place] at [facility] Discharge Instructions  You were seen today by Dr. Delton Coombes. He went over your recent scans and results; your JAK2 test, hepatitis, lupus, and H. pylori were all negative. Purchase vitamin B12 over the counter and take 1 mg daily. Drink 72 ounces of water daily. Dr. Delton Coombes will see you back in 6 months for labs and follow up.   Thank you for choosing [place] at [facility] to provide your oncology and hematology care.  To afford each patient quality time with our provider, please arrive at least 15 minutes before your scheduled appointment time.   If you have a lab appointment with the [facility] please come in thru the Main Entrance and check in at the main information desk  You need to re-schedule your appointment should you arrive 10 or more minutes late.  We strive to give you quality time with our providers, and arriving late affects you and other patients whose appointments are after yours.  Also, if you no show three or more times for appointments you may be dismissed from the clinic at the providers discretion.     Again, thank you for choosing [facility].  Our hope is that these requests will decrease the amount of time that you wait before being seen by our physicians.       _____________________________________________________________  Should you have questions after your visit to [facility], please contact our office at [phone] between the hours of 8:00 a.m. and 4:30 p.m.  Voicemails left after 4:00 p.m. will not be returned until the following business day.  For prescription refill requests, have your pharmacy contact our office and allow 72 hours.    Cancer Center Support Programs:   > Cancer Support Group  2nd Tuesday of the month 1pm-2pm, Journey Room

## 2019-10-16 NOTE — Progress Notes (Signed)
Frank Noble, [place] [id number]   CLINIC:  Medical Oncology/Hematology  PCP:  Doree Albee, MD [place] / [place] [address]  [phone]  REASON FOR VISIT:  Follow-up for thrombocytopenia and erythrocytosis  PRIOR THERAPY: None  CURRENT THERAPY: Observation  INTERVAL HISTORY:  [name], a 69 y.o. male, returns for routine follow-up for his thrombocytopenia and erythrocytosis. Saajan was last seen on 09/26/2019.  Today he reports that in 2016, when he was in [place], [place], he was still using the CPAP machine but not on testosterone. He denies having any headaches, though he has a history of migraines in the past. He denies blurry vision. He reports having some numbness and tingling in his right 2 medial fingers if he sleeps on his right arm, but otherwise denies any other numbness.  He sees Dr. Anastasio Champion twice a year with labs. He has received his Moderna COVID vaccine and his flu vaccine recently.   REVIEW OF SYSTEMS:  Review of Systems  Constitutional: Negative for appetite change and fatigue.  Eyes: Negative for eye problems.  Neurological: Positive for numbness (R hand 2 medial fingers at night). Negative for headaches.  Psychiatric/Behavioral: Positive for sleep disturbance.  All other systems reviewed and are negative.   PAST MEDICAL/SURGICAL HISTORY:  Past Medical History:  Diagnosis Date  . Allergy   . Arthritis    back  . BPH (benign prostatic hyperplasia) 11/28/2018  . Cataract   . Colon adenomas 03/2016   5, polyps  . GERD (gastroesophageal reflux disease)   . Heart murmur   . HLD (hyperlipidemia) 11/28/2018  . Hypercholesteremia   . Hypertension   . 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 Sleep apnea    wears CPAP  . Thyroid disease    hypothyroid  . Vitamin D deficiency disease 11/28/2018   Past Surgical History:  Procedure Laterality Date  . COLONOSCOPY N/A 04/05/2016   Procedure:  COLONOSCOPY;  Surgeon: [name], MD;  [facility]: [facility];  Service: Endoscopy;  Laterality: N/A;  8:30 Am  . COLONOSCOPY  07/10/2019   2018  . TONSILLECTOMY      SOCIAL HISTORY:  Social History   Socioeconomic History  . Marital status: Married    Spouse name: Not on file  . Number of children: 3  . Years of education: Not on file  . Highest education level: Not on file  Occupational History  . Occupation: retired  Tobacco Use  . Smoking status: Former Smoker    Packs/day: 0.50    Years: 10.00    Pack years: 5.00    Types: Cigarettes  . Smokeless tobacco: Never Used  . Tobacco comment: Quit age 59yrs  Vaping Use  . Vaping Use: Never used  Substance and Sexual Activity  . Alcohol use: Yes    Alcohol/week: 8.0 standard drinks    Types: 7 Cans of beer, 1 Shots of liquor per week    Comment: everyday, at least one beer a day  . Drug use: No  . Sexual activity: Not on file  Other Topics Concern  . Not on file  Social History Narrative         Married for 41 years.Lives with wife.Retired since Dec 2020..Originally from [place] for [place] years.    Social Determinants of Health   Financial Resource Strain:   . Difficulty of Paying Living Expenses: Not on file  Food Insecurity:   . Worried About Running  Out of Food in the Last Year: Not on file  . Ran Out of Food in the Last Year: Not on file  Transportation Needs:   . Lack of Transportation (Medical): Not on file  . Lack of Transportation (Non-Medical): Not on file  Physical Activity:   . Days of Exercise per Week: Not on file  . Minutes of Exercise per Session: Not on file  Stress:   . Feeling of Stress : Not on file  Social Connections:   . Frequency of Communication with Friends and Family: Not on file  . Frequency of Social Gatherings with Friends and Family: Not on file  . Attends Religious Services: Not on file  . Active Member of Clubs or Organizations: Not on file  . Attends Theatre manager Meetings: Not on file  . Marital Status: Not on file  Intimate Partner Violence:   . Fear of Current or Ex-Partner: Not on file  . Emotionally Abused: Not on file  . Physically Abused: Not on file  . Sexually Abused: Not on file    FAMILY HISTORY:  Family History  Problem Relation Age of Onset  . Breast cancer Mother        Mets to liver  . High blood pressure Father   . Aneurysm Father   . Esophageal cancer Maternal Grandmother   . Colon cancer Neg Hx   . Rectal cancer Neg Hx   . Stomach cancer Neg Hx     CURRENT MEDICATIONS:  Current Outpatient Medications  Medication Sig Dispense Refill  . amLODipine (NORVASC) 5 MG tablet TAKE 1 TABLET BY MOUTH EVERY DAY 30 tablet 3  . atorvastatin (LIPITOR) 20 MG tablet TAKE 1 TABLET EVERY DAY 90 tablet 0  . Cetirizine HCl (ZYRTEC PO) Take by mouth as needed.    . Cholecalciferol (VITAMIN D-3) 5000 units TABS Take 10,000 Units by mouth daily.     . Needles & Syringes MISC 1 mm by Does not apply route as directed. 50 Syringe 0  . omeprazole (PRILOSEC) 20 MG capsule     . Syringe, Disposable, (EASY GLIDE LUER LOCK SYRINGE) 1 ML MISC 1 Syringe by Does not apply route once a week. 100 each 0  . tadalafil (CIALIS) 5 MG tablet Take 1 tablet (5 mg total) by mouth daily. 90 tablet 1  . tamsulosin (FLOMAX) 0.4 MG CAPS capsule TAKE 1 CAPSULE DAILY (NEW) 90 capsule 1  . testosterone cypionate (DEPOTESTOSTERONE CYPIONATE) 200 MG/ML injection INJECT 0.5 MLS (100 MG TOTAL) INTO THE MUSCLE EVERY 7 (SEVEN) DAYS. INJECTS 0.5ML ON SUNDAYS. 10 mL 1  . thyroid (NP THYROID) 60 MG tablet Take 1 tablet (60 mg total) by mouth daily before breakfast. 90 tablet 1   Current Facility-Administered Medications  Medication Dose Route Frequency Provider Last Rate Last Admin  . 0.9 %  sodium chloride infusion  500 mL Intravenous Once Nandigam, Kavitha V, MD        ALLERGIES:  No Known Allergies  PHYSICAL EXAM:  Performance status (ECOG): 0 -  Asymptomatic  Vitals:   10/16/19 1209  BP: (!) 143/80  Pulse: 65  Resp: 18  Temp: (!) 97.1 F (36.2 C)  SpO2: 99%   Wt Readings from Last 3 Encounters:  10/16/19 215 lb 1.6 oz (97.6 kg)  09/26/19 212 lb 14.4 oz (96.6 kg)  08/28/19 213 lb 9.6 oz (96.9 kg)   Physical Exam  LABORATORY DATA:  I have reviewed the labs as listed.  CBC Latest Ref Rng &  Units 09/26/2019 08/28/2019 04/17/2019  WBC 4.0 - 10.5 K/uL 4.0 4.5 4.9  Hemoglobin 13.0 - 17.0 g/dL 19.1(H) 19.0(H) 19.8(H)  Hematocrit 39 - 52 % 55.8(H) 55.2(H) 58.0(H)  Platelets 150 - 400 K/uL 137(L) 128(L) 131(L)   CMP Latest Ref Rng & Units 08/28/2019 04/17/2019 02/14/2019  Glucose 65 - 99 mg/dL 88 100(H) 94  BUN 7 - 25 mg/dL 16 14 12   Creatinine 0.70 - 1.25 mg/dL 1.33(H) 1.29(H) 1.39(H)  Sodium 135 - 146 mmol/L 141 140 142  Potassium 3.5 - 5.3 mmol/L 4.3 4.2 4.6  Chloride 98 - 110 mmol/L 105 103 104  CO2 20 - 32 mmol/L 29 28 30   Calcium 8.6 - 10.3 mg/dL 8.8 9.5 9.1  Total Protein 6.1 - 8.1 g/dL 6.0(L) 6.4 6.6  Total Bilirubin 0.2 - 1.2 mg/dL 1.0 1.1 0.9  AST 10 - 35 U/L 28 26 27   ALT 9 - 46 U/L 35 33 28      Component Value Date/Time   RBC 5.77 09/26/2019 1130   MCV 96.7 09/26/2019 1130   MCH 33.1 09/26/2019 1130   MCHC 34.2 09/26/2019 1130   RDW 12.6 09/26/2019 1130   LYMPHSABS 1.2 09/26/2019 1130   MONOABS 0.4 09/26/2019 1130   EOSABS 0.1 09/26/2019 1130   BASOSABS 0.0 09/26/2019 1130    DIAGNOSTIC IMAGING:  I have independently reviewed the scans and discussed with the patient. No results found.   ASSESSMENT:  1.  Mild thrombocytopenia: -CBC from 08/28/2019 shows platelet count of 128.  Prior to that platelet count was in the 130s in March of this year. -Denies any easy bruising or bleeding. -Ultrasound of the abdomen on 04/25/2019 shows normal spleen.  No focal liver lesions.  Echogenic liver. -Obtained and reviewed his CBC from 09/11/2014 done in [place].  Platelet count was 138.  Hemoglobin at that  time was 15.2.  White count was normal. -Labs on 09/26/2019 showed borderline B12, borderline elevated methylmalonic acid.  Folic acid, LDH, copper levels were normal.  ANA, rheumatoid factor negative.  Hepatitis and H. pylori nonreactive.  2.  Erythrocytosis: -CBC on 08/28/2019 shows hemoglobin 19 and hematocrit of 55. -He has sleep apnea and uses CPAP for the last 10 years.  He is a non-smoker. -He is on testosterone supplements for the last 2 to 3 years. -JAK2 V617F testing negative.  Erythropoietin mildly elevated at 20.8.   PLAN:  1.  Mild thrombocytopenia: -Reviewed blood work from last visit which did not show any evidence of major abnormalities contributing to thrombocytopenia. -Differential diagnosis includes immune mediated thrombocytopenia versus early MDS. -B12 levels are borderline.  Advised B12 1 mg tablet daily. -No further work-up needed at this time.  Will reevaluate in 6 months with repeat labs.  2.  Erythrocytosis: -Reviewed labs from 09/26/2019.  Hemoglobin 19.1 and hematocrit 55.8. -JAK2 test negative ruling out polycythemia.  Erythropoietin mildly elevated.  No evidence of cysts or benign tumors in the liver or kidneys on prior ultrasound of the abdomen. -Erythrocytosis from testosterone supplements.  Will consider phlebotomy if the hemoglobin rises above 20-21 or if symptomatic. -He will continue to follow with Dr. Anastasio Champion for blood work.   Orders placed this encounter:  No orders of the defined types were placed in this encounter.    Derek Jack, MD [place] [phone]   I, Milinda Antis, am acting as a scribe for Dr. Sanda Linger.  I, Derek Jack MD, have reviewed the above documentation for accuracy and completeness, and I  agree with the above.

## 2019-10-17 ENCOUNTER — Ambulatory Visit (HOSPITAL_COMMUNITY): Payer: Medicare HMO | Admitting: Hematology

## 2019-10-17 DIAGNOSIS — H9042 Sensorineural hearing loss, unilateral, left ear, with unrestricted hearing on the contralateral side: Secondary | ICD-10-CM | POA: Diagnosis not present

## 2019-10-19 DIAGNOSIS — M65331 Trigger finger, right middle finger: Secondary | ICD-10-CM | POA: Diagnosis not present

## 2019-10-19 DIAGNOSIS — M65321 Trigger finger, right index finger: Secondary | ICD-10-CM | POA: Diagnosis not present

## 2019-10-22 ENCOUNTER — Other Ambulatory Visit (INDEPENDENT_AMBULATORY_CARE_PROVIDER_SITE_OTHER): Payer: Self-pay | Admitting: Nurse Practitioner

## 2019-10-31 ENCOUNTER — Encounter (INDEPENDENT_AMBULATORY_CARE_PROVIDER_SITE_OTHER): Payer: Self-pay | Admitting: Internal Medicine

## 2019-10-31 ENCOUNTER — Other Ambulatory Visit (INDEPENDENT_AMBULATORY_CARE_PROVIDER_SITE_OTHER): Payer: Self-pay | Admitting: Internal Medicine

## 2019-10-31 DIAGNOSIS — I10 Essential (primary) hypertension: Secondary | ICD-10-CM

## 2019-10-31 MED ORDER — AMLODIPINE BESYLATE 5 MG PO TABS: 5.0000 mg | ORAL_TABLET | Freq: Every day | ORAL | 1 refills | Status: DC

## 2019-11-01 ENCOUNTER — Other Ambulatory Visit (INDEPENDENT_AMBULATORY_CARE_PROVIDER_SITE_OTHER): Payer: Self-pay | Admitting: Internal Medicine

## 2019-11-05 ENCOUNTER — Other Ambulatory Visit (INDEPENDENT_AMBULATORY_CARE_PROVIDER_SITE_OTHER): Payer: Self-pay

## 2019-11-05 DIAGNOSIS — I10 Essential (primary) hypertension: Secondary | ICD-10-CM

## 2019-11-06 MED ORDER — AMLODIPINE BESYLATE 5 MG PO TABS: 5.0000 mg | ORAL_TABLET | Freq: Every day | ORAL | 1 refills | Status: DC

## 2019-11-07 DIAGNOSIS — M545 Low back pain, unspecified: Secondary | ICD-10-CM | POA: Diagnosis not present

## 2019-11-07 DIAGNOSIS — M47816 Spondylosis without myelopathy or radiculopathy, lumbar region: Secondary | ICD-10-CM | POA: Diagnosis not present

## 2019-11-07 DIAGNOSIS — M9903 Segmental and somatic dysfunction of lumbar region: Secondary | ICD-10-CM | POA: Diagnosis not present

## 2019-11-09 DIAGNOSIS — H9042 Sensorineural hearing loss, unilateral, left ear, with unrestricted hearing on the contralateral side: Secondary | ICD-10-CM | POA: Diagnosis not present

## 2019-11-14 ENCOUNTER — Encounter (INDEPENDENT_AMBULATORY_CARE_PROVIDER_SITE_OTHER): Payer: Self-pay | Admitting: Internal Medicine

## 2019-11-14 DIAGNOSIS — M545 Low back pain, unspecified: Secondary | ICD-10-CM | POA: Diagnosis not present

## 2019-11-14 DIAGNOSIS — M47816 Spondylosis without myelopathy or radiculopathy, lumbar region: Secondary | ICD-10-CM | POA: Diagnosis not present

## 2019-11-14 DIAGNOSIS — M9903 Segmental and somatic dysfunction of lumbar region: Secondary | ICD-10-CM | POA: Diagnosis not present

## 2019-11-20 ENCOUNTER — Encounter (INDEPENDENT_AMBULATORY_CARE_PROVIDER_SITE_OTHER): Payer: Self-pay | Admitting: Internal Medicine

## 2019-11-20 ENCOUNTER — Other Ambulatory Visit (INDEPENDENT_AMBULATORY_CARE_PROVIDER_SITE_OTHER): Payer: Self-pay | Admitting: Internal Medicine

## 2019-11-20 DIAGNOSIS — H9193 Unspecified hearing loss, bilateral: Secondary | ICD-10-CM

## 2019-11-21 DIAGNOSIS — M47816 Spondylosis without myelopathy or radiculopathy, lumbar region: Secondary | ICD-10-CM | POA: Diagnosis not present

## 2019-11-21 DIAGNOSIS — M9903 Segmental and somatic dysfunction of lumbar region: Secondary | ICD-10-CM | POA: Diagnosis not present

## 2019-11-21 DIAGNOSIS — M545 Low back pain, unspecified: Secondary | ICD-10-CM | POA: Diagnosis not present

## 2019-11-28 DIAGNOSIS — M9903 Segmental and somatic dysfunction of lumbar region: Secondary | ICD-10-CM | POA: Diagnosis not present

## 2019-11-28 DIAGNOSIS — M47816 Spondylosis without myelopathy or radiculopathy, lumbar region: Secondary | ICD-10-CM | POA: Diagnosis not present

## 2019-11-28 DIAGNOSIS — M545 Low back pain, unspecified: Secondary | ICD-10-CM | POA: Diagnosis not present

## 2019-12-03 ENCOUNTER — Telehealth: Payer: Self-pay | Admitting: *Deleted

## 2019-12-03 ENCOUNTER — Telehealth: Payer: Self-pay | Admitting: Neurology

## 2019-12-03 NOTE — Telephone Encounter (Signed)
I mailed cd on 12/03/19.

## 2019-12-03 NOTE — Telephone Encounter (Signed)
I reviewed MRI of the brain with and without contrast with one of our neuroradiologist Dr. Andrey Spearman.  We did not see anything unusual or concerning, no lesions or tumors or masses, unremarkable MRI of the brain for age.  I am more than happy to see him in clinic if he likes however given no findings in the brain its doubtful that his hearing loss is of primary neurologic cause.  I will give his MRI of the CD back to our staff to mail to him.  Thank you

## 2019-12-03 NOTE — Telephone Encounter (Signed)
I spoke with the patient and I read the message below from Dr Jaynee Eagles verbatim. The patient verbalized understanding and he agreed to forego scheduling an appointment. He stated that when he was younger he had chronic tonsillitis/throat infections up to the age of 85 and is curious if this could be related. Pt will call back if he changes his mind and wants to schedule an appointment. His questions were answered. He is also aware that our office will mail his MRI CD back to him. He verbalized appreciation for the call.

## 2019-12-05 DIAGNOSIS — M545 Low back pain, unspecified: Secondary | ICD-10-CM | POA: Diagnosis not present

## 2019-12-05 DIAGNOSIS — M47816 Spondylosis without myelopathy or radiculopathy, lumbar region: Secondary | ICD-10-CM | POA: Diagnosis not present

## 2019-12-05 DIAGNOSIS — M9903 Segmental and somatic dysfunction of lumbar region: Secondary | ICD-10-CM | POA: Diagnosis not present

## 2019-12-11 ENCOUNTER — Other Ambulatory Visit (INDEPENDENT_AMBULATORY_CARE_PROVIDER_SITE_OTHER): Payer: Self-pay | Admitting: Internal Medicine

## 2019-12-19 ENCOUNTER — Other Ambulatory Visit (INDEPENDENT_AMBULATORY_CARE_PROVIDER_SITE_OTHER): Payer: Self-pay | Admitting: Internal Medicine

## 2019-12-19 DIAGNOSIS — M9903 Segmental and somatic dysfunction of lumbar region: Secondary | ICD-10-CM | POA: Diagnosis not present

## 2019-12-19 DIAGNOSIS — M545 Low back pain, unspecified: Secondary | ICD-10-CM | POA: Diagnosis not present

## 2019-12-19 DIAGNOSIS — M47816 Spondylosis without myelopathy or radiculopathy, lumbar region: Secondary | ICD-10-CM | POA: Diagnosis not present

## 2019-12-26 DIAGNOSIS — M545 Low back pain, unspecified: Secondary | ICD-10-CM | POA: Diagnosis not present

## 2019-12-26 DIAGNOSIS — M47816 Spondylosis without myelopathy or radiculopathy, lumbar region: Secondary | ICD-10-CM | POA: Diagnosis not present

## 2019-12-26 DIAGNOSIS — M9903 Segmental and somatic dysfunction of lumbar region: Secondary | ICD-10-CM | POA: Diagnosis not present

## 2020-01-02 DIAGNOSIS — M545 Low back pain, unspecified: Secondary | ICD-10-CM | POA: Diagnosis not present

## 2020-01-02 DIAGNOSIS — M47816 Spondylosis without myelopathy or radiculopathy, lumbar region: Secondary | ICD-10-CM | POA: Diagnosis not present

## 2020-01-02 DIAGNOSIS — M9903 Segmental and somatic dysfunction of lumbar region: Secondary | ICD-10-CM | POA: Diagnosis not present

## 2020-01-07 ENCOUNTER — Other Ambulatory Visit (INDEPENDENT_AMBULATORY_CARE_PROVIDER_SITE_OTHER): Payer: Self-pay | Admitting: Internal Medicine

## 2020-01-14 ENCOUNTER — Encounter (INDEPENDENT_AMBULATORY_CARE_PROVIDER_SITE_OTHER): Payer: Self-pay | Admitting: Internal Medicine

## 2020-01-14 DIAGNOSIS — M545 Low back pain, unspecified: Secondary | ICD-10-CM | POA: Diagnosis not present

## 2020-01-14 DIAGNOSIS — M9903 Segmental and somatic dysfunction of lumbar region: Secondary | ICD-10-CM | POA: Diagnosis not present

## 2020-01-14 DIAGNOSIS — M47816 Spondylosis without myelopathy or radiculopathy, lumbar region: Secondary | ICD-10-CM | POA: Diagnosis not present

## 2020-01-15 ENCOUNTER — Other Ambulatory Visit (INDEPENDENT_AMBULATORY_CARE_PROVIDER_SITE_OTHER): Payer: Self-pay | Admitting: Internal Medicine

## 2020-01-15 MED ORDER — TESTOSTERONE CYPIONATE 200 MG/ML IM SOLN
100.0000 mg | INTRAMUSCULAR | 1 refills | Status: DC
Start: 2020-01-15 — End: 2020-03-25

## 2020-01-28 DIAGNOSIS — M9903 Segmental and somatic dysfunction of lumbar region: Secondary | ICD-10-CM | POA: Diagnosis not present

## 2020-01-28 DIAGNOSIS — M545 Low back pain, unspecified: Secondary | ICD-10-CM | POA: Diagnosis not present

## 2020-01-28 DIAGNOSIS — M47816 Spondylosis without myelopathy or radiculopathy, lumbar region: Secondary | ICD-10-CM | POA: Diagnosis not present

## 2020-02-11 DIAGNOSIS — M47816 Spondylosis without myelopathy or radiculopathy, lumbar region: Secondary | ICD-10-CM | POA: Diagnosis not present

## 2020-02-11 DIAGNOSIS — M9903 Segmental and somatic dysfunction of lumbar region: Secondary | ICD-10-CM | POA: Diagnosis not present

## 2020-02-11 DIAGNOSIS — M545 Low back pain, unspecified: Secondary | ICD-10-CM | POA: Diagnosis not present

## 2020-02-21 ENCOUNTER — Other Ambulatory Visit (INDEPENDENT_AMBULATORY_CARE_PROVIDER_SITE_OTHER): Payer: Self-pay | Admitting: Internal Medicine

## 2020-02-25 ENCOUNTER — Other Ambulatory Visit: Payer: Self-pay | Admitting: Orthopaedic Surgery

## 2020-02-25 DIAGNOSIS — M9903 Segmental and somatic dysfunction of lumbar region: Secondary | ICD-10-CM | POA: Diagnosis not present

## 2020-02-25 DIAGNOSIS — M79605 Pain in left leg: Secondary | ICD-10-CM

## 2020-02-25 DIAGNOSIS — M47816 Spondylosis without myelopathy or radiculopathy, lumbar region: Secondary | ICD-10-CM | POA: Diagnosis not present

## 2020-02-25 DIAGNOSIS — M545 Low back pain, unspecified: Secondary | ICD-10-CM | POA: Diagnosis not present

## 2020-02-26 ENCOUNTER — Ambulatory Visit (INDEPENDENT_AMBULATORY_CARE_PROVIDER_SITE_OTHER): Payer: Medicare HMO | Admitting: Internal Medicine

## 2020-02-26 ENCOUNTER — Other Ambulatory Visit: Payer: Self-pay

## 2020-02-26 ENCOUNTER — Encounter (INDEPENDENT_AMBULATORY_CARE_PROVIDER_SITE_OTHER): Payer: Self-pay | Admitting: Internal Medicine

## 2020-02-26 VITALS — BP 126/80 | HR 72 | Ht 69.0 in | Wt 214.4 lb

## 2020-02-26 DIAGNOSIS — E291 Testicular hypofunction: Secondary | ICD-10-CM | POA: Diagnosis not present

## 2020-02-26 DIAGNOSIS — E559 Vitamin D deficiency, unspecified: Secondary | ICD-10-CM | POA: Diagnosis not present

## 2020-02-26 DIAGNOSIS — R1319 Other dysphagia: Secondary | ICD-10-CM | POA: Diagnosis not present

## 2020-02-26 DIAGNOSIS — G8929 Other chronic pain: Secondary | ICD-10-CM

## 2020-02-26 DIAGNOSIS — H539 Unspecified visual disturbance: Secondary | ICD-10-CM | POA: Diagnosis not present

## 2020-02-26 DIAGNOSIS — E782 Mixed hyperlipidemia: Secondary | ICD-10-CM | POA: Diagnosis not present

## 2020-02-26 DIAGNOSIS — R3914 Feeling of incomplete bladder emptying: Secondary | ICD-10-CM

## 2020-02-26 DIAGNOSIS — M545 Low back pain, unspecified: Secondary | ICD-10-CM

## 2020-02-26 DIAGNOSIS — N401 Enlarged prostate with lower urinary tract symptoms: Secondary | ICD-10-CM | POA: Diagnosis not present

## 2020-02-26 DIAGNOSIS — I10 Essential (primary) hypertension: Secondary | ICD-10-CM | POA: Diagnosis not present

## 2020-02-26 NOTE — Progress Notes (Signed)
Metrics: Intervention Frequency ACO  Documented Smoking Status Yearly  Screened one or more times in 24 months  Cessation Counseling or  Active cessation medication Past 24 months  Past 24 months   Guideline developer: UpToDate (See UpToDate for funding source) Date Released: 2014       Wellness Office Visit  Subjective:  Patient ID: Frank Noble, male    DOB: 1950/03/23  Age: 70 y.o. MRN: [id number]  CC: This man comes in for follow-up of hypertension, dyslipidemia, BPH, chronic low back pain, vitamin D deficiency. HPI  His low back pain still bothers him but he is seeing a Restaurant manager, fast food.  The pain is typically worse in the morning and then throughout the day tends to improve which is fairly typical. He continues with testosterone therapy as before and is tolerating this well. He has dyslipidemia and takes statin therapy which she is tolerating.  He has no history of coronary artery disease or cerebrovascular disease. He has BPH and takes Cialis. Today is complaining of a visual disturbance that he experienced 3 weeks ago.  He likes to play video games and sometimes will spend 2 to 6 hours at a time.  On this particular occasion 3 weeks ago, he suddenly noticed that he could not focus on the screen.  Since this episode, every time he plays video games, he is unable to focus.  He denies any double vision or loss of field of vision.  He has an appointment coming up with his ophthalmology in the next month or 2.  When he is not playing video games, his vision is perfectly normal. He continues on desiccated NP thyroid, which is being used off label, for symptoms of thyroid hypofunction. He is also describing degree of dysphagia which seems to be esophageal in nature and a feeling of sticking of food. Past Medical History:  Diagnosis Date  . Allergy   . Arthritis    back  . BPH (benign prostatic hyperplasia) 11/28/2018  . Cataract   . Colon adenomas 03/2016   5, polyps  . GERD  (gastroesophageal reflux disease)   . Heart murmur   . HLD (hyperlipidemia) 11/28/2018  . Hypercholesteremia   . Hypertension   . 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 Sleep apnea    wears CPAP  . Thyroid disease    hypothyroid  . Vitamin D deficiency disease 11/28/2018   Past Surgical History:  Procedure Laterality Date  . COLONOSCOPY N/A 04/05/2016   Procedure: COLONOSCOPY;  Surgeon: [name], MD;  [facility]: [facility];  Service: Endoscopy;  Laterality: N/A;  8:30 Am  . COLONOSCOPY  07/10/2019   2018  . TONSILLECTOMY       Family History  Problem Relation Age of Onset  . Breast cancer Mother        Mets to liver  . High blood pressure Father   . Aneurysm Father   . Esophageal cancer Maternal Grandmother   . Colon cancer Neg Hx   . Rectal cancer Neg Hx   . Stomach cancer Neg Hx     Social History   Social History Narrative         Married for 42 years.Lives with wife.Retired since Dec 2020..Originally from [place] for [place] years.    Social History   Tobacco Use  . Smoking status: Former Smoker    Packs/day: 0.50    Years: 10.00    Pack years: 5.00    Types: Cigarettes  . Smokeless tobacco: Never Used  .  Tobacco comment: Quit age 55yrs  Substance Use Topics  . Alcohol use: Yes    Alcohol/week: 8.0 standard drinks    Types: 7 Cans of beer, 1 Shots of liquor per week    Comment: everyday, at least one beer a day    Current Meds  Medication Sig  . amLODipine (NORVASC) 5 MG tablet Take 1 tablet (5 mg total) by mouth daily.  Marland Kitchen atorvastatin (LIPITOR) 20 MG tablet TAKE 1 TABLET EVERY DAY  . Cetirizine HCl (ZYRTEC PO) Take by mouth as needed.  . Cholecalciferol (VITAMIN D-3) 5000 units TABS Take 10,000 Units by mouth daily.   . NP THYROID 60 MG tablet TAKE 1 TABLET BY MOUTH DAILY BEFORE BREAKFAST  . omeprazole (PRILOSEC) 20 MG capsule Take 1 capsule by mouth daily.  . tadalafil (CIALIS) 5 MG tablet TAKE ONE TABLET BY MOUTH DAILY  . tamsulosin  (FLOMAX) 0.4 MG CAPS capsule TAKE 1 CAPSULE DAILY (NEW)  . testosterone cypionate (DEPOTESTOSTERONE CYPIONATE) 200 MG/ML injection Inject 0.5 mLs (100 mg total) into the muscle every 7 (seven) days. Injects 0.1ml on Sundays.      Depression screen PHQ 2/9 11/28/2018  Decreased Interest 0  Down, Depressed, Hopeless 0  PHQ - 2 Score 0     Objective:   Today's Vitals: BP 126/80   Pulse 72   Ht 5\' 9"  (1.753 m)   Wt 214 lb 6.4 oz (97.3 kg)   BMI 31.66 kg/m  Vitals with BMI 02/26/2020 10/16/2019 09/26/2019  Height 5\' 9"  - 5\' 9"   Weight 214 lbs 6 oz 215 lbs 2 oz 212 lbs 14 oz  BMI 31.65 29.92 42.68  Systolic 341 962 229  Diastolic 80 80 84  Pulse 72 65 71     Physical Exam   He remains obese and weight has not changed.  Blood pressure is normal today.  He is alert and orientated without any focal neurological signs.  Heart sounds are present without murmurs.  There are no carotid bruits.  Lung fields are clear.  External ocular movements are normal with no evidence of nystagmus.  Visual fields are intact.    Assessment   1. Chronic midline low back pain without sciatica   2. Essential hypertension, benign   3. Primary testicular failure   4. Mixed hyperlipidemia   5. Benign prostatic hyperplasia with incomplete bladder emptying   6. Vitamin D deficiency disease   7. Vision disturbance   8. Esophageal dysphagia       Tests ordered Orders Placed This Encounter  Procedures  . Lipid panel  . PSA, Total with Reflex to PSA, Free  . COMPLETE METABOLIC PANEL WITH GFR  . Ambulatory referral to Gastroenterology     Plan: 1. He will continue with amlodipine for his hypertension.  He does tell me that in the evening, his blood pressure was raised 3 weeks ago but I have asked him to keep a log of morning and evening blood pressures for the next couple of weeks and let me know if there is elevations. 2. He will continue with testosterone therapy as before and as he is tolerating  this. 3. He will continue with statin therapy for his dyslipidemia. 4. He will continue with Cialis for his BPH symptoms. 5. He will see his ophthalmologist in the next month or 2 to get an opinion regarding his visual disturbance. 6. I will refer him to gastroenterology for his dysphagia. 7. Follow-up in about 4 months.  Further recommendations will depend  on blood results ordered above.   No orders of the defined types were placed in this encounter.   Doree Albee, MD

## 2020-02-27 LAB — COMPLETE METABOLIC PANEL WITH GFR
AG Ratio: 2.9 (calc) — ABNORMAL HIGH (ref 1.0–2.5)
ALT: 33 U/L (ref 9–46)
AST: 25 U/L (ref 10–35)
Albumin: 4.4 g/dL (ref 3.6–5.1)
Alkaline phosphatase (APISO): 88 U/L (ref 35–144)
BUN: 13 mg/dL (ref 7–25)
CO2: 28 mmol/L (ref 20–32)
Calcium: 9.1 mg/dL (ref 8.6–10.3)
Chloride: 108 mmol/L (ref 98–110)
Creat: 1.21 mg/dL (ref 0.70–1.25)
GFR, Est African American: 71 mL/min/{1.73_m2} (ref >=60)
GFR, Est Non African American: 61 mL/min/{1.73_m2} (ref >=60)
Globulin: 1.5 g/dL (calc) — ABNORMAL LOW (ref 1.9–3.7)
Glucose, Bld: 88 mg/dL (ref 65–139)
Potassium: 4.3 mmol/L (ref 3.5–5.3)
Sodium: 143 mmol/L (ref 135–146)
Total Bilirubin: 0.8 mg/dL (ref 0.2–1.2)
Total Protein: 5.9 g/dL — ABNORMAL LOW (ref 6.1–8.1)

## 2020-02-27 LAB — LIPID PANEL
Cholesterol: 119 mg/dL (ref <200)
HDL: 33 mg/dL — ABNORMAL LOW (ref >=40)
LDL Cholesterol (Calc): 62 mg/dL (calc)
Non-HDL Cholesterol (Calc): 86 mg/dL (calc) (ref <130)
Total CHOL/HDL Ratio: 3.6 (calc) (ref <5.0)
Triglycerides: 153 mg/dL — ABNORMAL HIGH (ref <150)

## 2020-02-27 LAB — PSA, TOTAL WITH REFLEX TO PSA, FREE: PSA, Total: 2.2 ng/mL (ref <=4.0)

## 2020-03-10 DIAGNOSIS — M47816 Spondylosis without myelopathy or radiculopathy, lumbar region: Secondary | ICD-10-CM | POA: Diagnosis not present

## 2020-03-10 DIAGNOSIS — M9903 Segmental and somatic dysfunction of lumbar region: Secondary | ICD-10-CM | POA: Diagnosis not present

## 2020-03-10 DIAGNOSIS — M545 Low back pain, unspecified: Secondary | ICD-10-CM | POA: Diagnosis not present

## 2020-03-13 ENCOUNTER — Encounter: Payer: Self-pay | Admitting: Nurse Practitioner

## 2020-03-13 ENCOUNTER — Ambulatory Visit: Payer: Medicare HMO | Admitting: Nurse Practitioner

## 2020-03-13 VITALS — BP 130/70 | HR 68 | Ht 69.0 in | Wt 213.0 lb

## 2020-03-13 DIAGNOSIS — K219 Gastro-esophageal reflux disease without esophagitis: Secondary | ICD-10-CM | POA: Diagnosis not present

## 2020-03-13 DIAGNOSIS — D751 Secondary polycythemia: Secondary | ICD-10-CM | POA: Diagnosis not present

## 2020-03-13 DIAGNOSIS — R1310 Dysphagia, unspecified: Secondary | ICD-10-CM | POA: Insufficient documentation

## 2020-03-13 NOTE — Patient Instructions (Addendum)
If you are age 69 or older, your body mass index should be between 23-30. Your Body mass index is 31.45 kg/m. If this is out of the aforementioned range listed, please consider follow up with your Primary Care Provider.  PROCEDURES: You have been scheduled for a endoscopy. Please follow the written instructions given to you at your visit today. If you use inhalers (even only as needed), please bring them with you on the day of your procedure.  RECOMMENDATIONS:  Please continue taking Omeprazole 20 MG once a day. Please call our office if your symptoms worsen.  It was great seeing you today! Thank you for entrusting me with your care and choosing [facility].  Noralyn Pick, CRNP

## 2020-03-13 NOTE — Progress Notes (Signed)
03/13/2020 Frank Noble [id number] Feb 03, 1950   Chief Complaint: Dysphagia  History of Present Illness: Frank Noble is a 70 year old male with a past medical history of arthritis, hypertension, hypercholesterolemia, hypothyroidism, sleep apnea uses CPAP, BPH, erythrocytosis and thrombocytopenia GERD and colon polyps.  He presents to our office today as referred by Dr. Anastasio Champion for further evaluation regarding dysphagia.  He reports having a history of GERD since he was a teenager.  He reports taking Prilosec daily for the past 15 years.  He tried to wean off of Prilosec 3 years ago but he developed heartburn symptoms when taking Prilosec every other day.  He reports having intermittent dysphagia symptoms for many years but he did not really pay attention to it.  He saw his chiropractor November 2021 who thought he may have a cervical spur close to his esophagus and he was advised to monitor for any difficulty swallowing.  Since that time, he has paid more attention to his dysphagia and he reports having difficulty swallowing food approximately once weekly.  Foods such as tortilla or bread will briefly get stuck to the upper esophagus then passes if he waits a few seconds or if he drinks water.  He denies coughing or expelling out the stuck food.  He denies having any heartburn as long as he takes his Prilosec daily.  His maternal grandmother died from esophageal cancer in her mid 19s, she was a smoker.  He is passing  normal formed brown bowel movement daily.  No rectal bleeding or black stools.  He has a history of colon polyps.  He underwent a colonoscopy by Dr. Silverio Decamp 07/10/2019 and 9 tubular adenomatous/sessile serrated polyps were removed from the colon.  He was advised to repeat a colonoscopy in 3 years.  He has a history of erythrocytosis and thrombocytopenia.  He is followed by hematologist Dr. Delton Coombes.  His erythrocytosis was thought to be due to taking testosterone for the past 3  years.  Hg 19.1 - 19.8. The etiology for his thrombocytopenia is unclear as he had an abdominal ultrasound 04/2019 which showed evidence of hepatic steatosis with a normal spleen.   CBC Latest Ref Rng & Units 09/26/2019 08/28/2019 04/17/2019  WBC 4.0 - 10.5 K/uL 4.0 4.5 4.9  Hemoglobin 13.0 - 17.0 g/dL 19.1(H) 19.0(H) 19.8(H)  Hematocrit 39.0 - 52.0 % 55.8(H) 55.2(H) 58.0(H)  Platelets 150 - 400 K/uL 137(L) 128(L) 131(L)    CMP Latest Ref Rng & Units 02/26/2020 08/28/2019 04/17/2019  Glucose 65 - 139 mg/dL 88 88 100(H)  BUN 7 - 25 mg/dL 13 16 14   Creatinine 0.70 - 1.25 mg/dL 1.21 1.33(H) 1.29(H)  Sodium 135 - 146 mmol/L 143 141 140  Potassium 3.5 - 5.3 mmol/L 4.3 4.3 4.2  Chloride 98 - 110 mmol/L 108 105 103  CO2 20 - 32 mmol/L 28 29 28   Calcium 8.6 - 10.3 mg/dL 9.1 8.8 9.5  Total Protein 6.1 - 8.1 g/dL 5.9(L) 6.0(L) 6.4  Total Bilirubin 0.2 - 1.2 mg/dL 0.8 1.0 1.1  AST 10 - 35 U/L 25 28 26   ALT 9 - 46 U/L 33 35 33   Colonoscopy 07/10/2019: - Two 1 to 2 mm polyps in the ascending colon, removed with a cold biopsy forceps. Resected and retrieved. - Seven 3 to 5 mm polyps in the transverse colon and in the ascending colon, removed with a cold snare. Resected and retrieved. cold snare. Resected and retrieved. - Diverticulosis in the sigmoid colon. - Non-bleeding internal hemorrhoids. -  The examination was otherwise normal. -Recall colonoscopy 3 years - MULTIPLE FRAGMENTS OF TUBULAR ADENOMA(S) - SESSILE SERRATED POLYP (1 FRAGMENT) - NO HIGH GRADE DYSPLASIA OR MALIGNANCY IDENTIFIED  Past Medical History:  Diagnosis Date  . Allergy   . Arthritis    back  . BPH (benign prostatic hyperplasia) 11/28/2018  . Cataract   . Colon adenomas 03/2016   5, polyps  . GERD (gastroesophageal reflux disease)   . Heart murmur   . HLD (hyperlipidemia) 11/28/2018  . Hypercholesteremia   . Hypertension   . 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 Sleep apnea    wears CPAP  . Thyroid disease     hypothyroid  . Vitamin D deficiency disease 11/28/2018   Past Surgical History:  Procedure Laterality Date  . COLONOSCOPY N/A 04/05/2016   Procedure: COLONOSCOPY;  Surgeon: [name], MD;  Location: [facility];  Service: Endoscopy;  Laterality: N/A;  8:30 Am  . COLONOSCOPY  07/10/2019   2018  . TONSILLECTOMY      Current Outpatient Medications on File Prior to Visit  Medication Sig Dispense Refill  . amLODipine (NORVASC) 5 MG tablet Take 1 tablet (5 mg total) by mouth daily. 90 tablet 1  . atorvastatin (LIPITOR) 20 MG tablet TAKE 1 TABLET EVERY DAY 90 tablet 0  . Cetirizine HCl (ZYRTEC PO) Take by mouth as needed.    . Cholecalciferol (VITAMIN D-3) 5000 units TABS Take 10,000 Units by mouth daily.     . NP THYROID 60 MG tablet TAKE 1 TABLET BY MOUTH DAILY BEFORE BREAKFAST 90 tablet 1  . omeprazole (PRILOSEC) 20 MG capsule Take 1 capsule by mouth daily. 90 capsule 0  . tadalafil (CIALIS) 5 MG tablet TAKE ONE TABLET BY MOUTH DAILY 90 tablet 1  . tamsulosin (FLOMAX) 0.4 MG CAPS capsule TAKE 1 CAPSULE DAILY (NEW) 90 capsule 1  . testosterone cypionate (DEPOTESTOSTERONE CYPIONATE) 200 MG/ML injection Inject 0.5 mLs (100 mg total) into the muscle every 7 (seven) days. Injects 0.24ml on Sundays. 10 mL 1   No current facility-administered medications on file prior to visit.   No Known Allergies   Current Medications, Allergies, Past Medical History, Past Surgical History, Family History and Social History were reviewed in [employer] record.   Review of Systems:   Constitutional: Negative for fever, sweats, chills or weight loss.  Respiratory: Negative for shortness of breath.   Cardiovascular: Negative for chest pain, palpitations and leg swelling.  Gastrointestinal: See HPI.  Musculoskeletal: Negative for back pain or muscle aches.  Neurological: Negative for dizziness, headaches or paresthesias.    Physical Exam: BP 130/70   Pulse 68   Ht 5\' 9"   (1.753 m)   Wt 213 lb (96.6 kg)   BMI 31.45 kg/m  General: 70 year old male in no acute distress. Head: Normocephalic and atraumatic. Eyes: No scleral icterus. Conjunctiva pink . Ears: Normal auditory acuity. Mouth: Dentition intact. No ulcers or lesions.  Lungs: Clear throughout to auscultation. Heart: Regular rate and rhythm, no murmur. Abdomen: Soft, nontender and nondistended. No masses or hepatomegaly. Normal bowel sounds x 4 quadrants.  Rectal: Deferred.  Musculoskeletal: Symmetrical with no gross deformities. Extremities: No edema. Neurological: Alert oriented x 4. No focal deficits.  Psychological: Alert and cooperative. Normal mood and affect  Assessment and Recommendations:  32.  70 year old male with chronic GERD presents for further evaluation regarding dysphagia which occurs approximately once weekly associated with eating dry foods. -Continue Omeprazole 20 mg daily -Drink 3  separate sips of water before swallowing any medication or food -Avoid eating while driving -EGD with possible esophageal dilatation benefits and risks discussed including risk with sedation, risk of bleeding, perforation and infection  -Patient to call our office if his symptoms worsen  2.  History of tubular adenomatous/sessile serrated colon polyps -Next colonoscopy due/2024  3.  Thrombocytopenia, unclear etiology. Abd sono 04/2019 showed evidence of hepatic steatosis without evidence of cirrhosis and a normal spleen.  Followed by hematologist Dr. Delton Coombes  4.  Erythrocytosis thought to be due to testosterone therapy, followed by hematologist Dr. Delton Coombes  -Iron, iron saturation, ferritin and TIBC. If iron level elevated he will require hemochromatosis/HFE mutation testing  5.  Fatty liver per abdominal sonogram 04/2019.  Normal LFTs.  Further follow-up to be determined after the above evaluation completed

## 2020-03-14 ENCOUNTER — Other Ambulatory Visit: Payer: Medicare HMO

## 2020-03-14 DIAGNOSIS — K219 Gastro-esophageal reflux disease without esophagitis: Secondary | ICD-10-CM

## 2020-03-14 DIAGNOSIS — R1310 Dysphagia, unspecified: Secondary | ICD-10-CM | POA: Diagnosis not present

## 2020-03-14 DIAGNOSIS — D751 Secondary polycythemia: Secondary | ICD-10-CM | POA: Diagnosis not present

## 2020-03-15 LAB — IRON,TIBC AND FERRITIN PANEL
%SAT: 95 % (calc) — ABNORMAL HIGH (ref 20–48)
Ferritin: 95 ng/mL (ref 24–380)
Iron: 209 ug/dL — ABNORMAL HIGH (ref 50–180)
TIBC: 219 mcg/dL (calc) — ABNORMAL LOW (ref 250–425)

## 2020-03-17 NOTE — Progress Notes (Signed)
Reviewed and agree with documentation and assessment and plan. K. Veena Arina Torry , MD   

## 2020-03-19 ENCOUNTER — Other Ambulatory Visit: Payer: Self-pay | Admitting: Nurse Practitioner

## 2020-03-19 ENCOUNTER — Other Ambulatory Visit: Payer: Self-pay | Admitting: General Surgery

## 2020-03-19 DIAGNOSIS — D751 Secondary polycythemia: Secondary | ICD-10-CM

## 2020-03-20 ENCOUNTER — Other Ambulatory Visit: Payer: Medicare HMO

## 2020-03-20 ENCOUNTER — Telehealth: Payer: Self-pay

## 2020-03-20 DIAGNOSIS — D751 Secondary polycythemia: Secondary | ICD-10-CM | POA: Diagnosis not present

## 2020-03-20 NOTE — Telephone Encounter (Signed)
TC from lab that the additional test order was incorrect, they could not tell me what test to order. I hope I got the one you were looking for.

## 2020-03-24 DIAGNOSIS — M47816 Spondylosis without myelopathy or radiculopathy, lumbar region: Secondary | ICD-10-CM | POA: Diagnosis not present

## 2020-03-24 DIAGNOSIS — M545 Low back pain, unspecified: Secondary | ICD-10-CM | POA: Diagnosis not present

## 2020-03-24 DIAGNOSIS — M9903 Segmental and somatic dysfunction of lumbar region: Secondary | ICD-10-CM | POA: Diagnosis not present

## 2020-03-25 ENCOUNTER — Other Ambulatory Visit (INDEPENDENT_AMBULATORY_CARE_PROVIDER_SITE_OTHER): Payer: Self-pay | Admitting: Internal Medicine

## 2020-03-25 ENCOUNTER — Encounter (INDEPENDENT_AMBULATORY_CARE_PROVIDER_SITE_OTHER): Payer: Self-pay | Admitting: Internal Medicine

## 2020-03-31 LAB — HEMOCHROMATOSIS DNA-PCR(C282Y,H63D)

## 2020-04-02 ENCOUNTER — Other Ambulatory Visit: Payer: Self-pay

## 2020-04-03 ENCOUNTER — Encounter: Payer: Self-pay | Admitting: Gastroenterology

## 2020-04-04 ENCOUNTER — Ambulatory Visit (AMBULATORY_SURGERY_CENTER): Payer: Medicare HMO | Admitting: Gastroenterology

## 2020-04-04 ENCOUNTER — Encounter: Payer: Self-pay | Admitting: Gastroenterology

## 2020-04-04 ENCOUNTER — Other Ambulatory Visit: Payer: Self-pay

## 2020-04-04 VITALS — BP 92/52 | HR 58 | Temp 98.4°F | Resp 11 | Ht 69.0 in | Wt 213.0 lb

## 2020-04-04 DIAGNOSIS — K2970 Gastritis, unspecified, without bleeding: Secondary | ICD-10-CM

## 2020-04-04 DIAGNOSIS — K449 Diaphragmatic hernia without obstruction or gangrene: Secondary | ICD-10-CM | POA: Diagnosis not present

## 2020-04-04 DIAGNOSIS — R1310 Dysphagia, unspecified: Secondary | ICD-10-CM | POA: Diagnosis not present

## 2020-04-04 DIAGNOSIS — K319 Disease of stomach and duodenum, unspecified: Secondary | ICD-10-CM

## 2020-04-04 DIAGNOSIS — K219 Gastro-esophageal reflux disease without esophagitis: Secondary | ICD-10-CM

## 2020-04-04 DIAGNOSIS — K3189 Other diseases of stomach and duodenum: Secondary | ICD-10-CM | POA: Diagnosis not present

## 2020-04-04 MED ORDER — SODIUM CHLORIDE 0.9 % IV SOLN: 500.0000 mL | Freq: Once | INTRAVENOUS | Status: DC

## 2020-04-04 MED ORDER — OMEPRAZOLE 40 MG PO CPDR: 40.0000 mg | DELAYED_RELEASE_CAPSULE | Freq: Every day | ORAL | 3 refills | Status: DC

## 2020-04-04 NOTE — Patient Instructions (Signed)
Antireflux regime info was given Resume previous diet Continue current medications Await pathology results Start omeprazole 40mg  daily for 6 months. Be sure to take at least 30 min before eating and ONLY with water  YOU HAD AN ENDOSCOPIC PROCEDURE TODAY AT [facility]:   Refer to the procedure report that was given to you for any specific questions about what was found during the examination.  If the procedure report does not answer your questions, please call your gastroenterologist to clarify.  If you requested that your care partner not be given the details of your procedure findings, then the procedure report has been included in a sealed envelope for you to review at your convenience later.  YOU SHOULD EXPECT: Some feelings of bloating in the abdomen. Passage of more gas than usual.  Walking can help get rid of the air that was put into your GI tract during the procedure and reduce the bloating. If you had a lower endoscopy (such as a colonoscopy or flexible sigmoidoscopy) you may notice spotting of blood in your stool or on the toilet paper. If you underwent a bowel prep for your procedure, you may not have a normal bowel movement for a few days.  Please Note:  You might notice some irritation and congestion in your nose or some drainage.  This is from the oxygen used during your procedure.  There is no need for concern and it should clear up in a day or so.  SYMPTOMS TO REPORT IMMEDIATELY:   Following upper endoscopy (EGD)  Vomiting of blood or coffee ground material  New chest pain or pain under the shoulder blades  Painful or persistently difficult swallowing  New shortness of breath  Fever of 100F or higher  Black, tarry-looking stools  For urgent or emergent issues, a gastroenterologist can be reached at any hour by calling [phone]. Do not use MyChart messaging for urgent concerns.   DIET:  We do recommend a small meal at first, but then you may proceed  to your regular diet.  Drink plenty of fluids but you should avoid alcoholic beverages for 24 hours.  ACTIVITY:  You should plan to take it easy for the rest of today and you should NOT DRIVE or use heavy machinery until tomorrow (because of the sedation medicines used during the test).    FOLLOW UP: Our staff will call the number listed on your records 48-72 hours following your procedure to check on you and address any questions or concerns that you may have regarding the information given to you following your procedure. If we do not reach you, we will leave a message.  We will attempt to reach you two times.  During this call, we will ask if you have developed any symptoms of COVID 19. If you develop any symptoms (ie: fever, flu-like symptoms, shortness of breath, cough etc.) before then, please call [phone].  If you test positive for Covid 19 in the 2 weeks post procedure, please call and report this information to [place].    If any biopsies were taken you will be contacted by phone or by letter within the next 1-3 weeks.  Please call us at [phone] if you have not heard about the biopsies in 3 weeks.   SIGNATURES/CONFIDENTIALITY: You and/or your care partner have signed paperwork which will be entered into your electronic medical record.  These signatures attest to the fact that that the information above on your After Visit Summary has been  reviewed and is understood.  Full responsibility of the confidentiality of this discharge information lies with you and/or your care-partner. 

## 2020-04-04 NOTE — Progress Notes (Signed)
Called to room to assist during endoscopic procedure.  Patient ID and intended procedure confirmed with present staff. Received instructions for my participation in the procedure from the performing physician.  

## 2020-04-04 NOTE — Op Note (Addendum)
[facility] Patient Name: Frank Noble Procedure Date: 04/04/2020 12:02 PM MRN: [id number] Endoscopist: Mauri Pole , MD Age: 70 Referring MD:  Date of Birth: Jul 15, 1950 Gender: Male Account #: [id number] Procedure:                Upper GI endoscopy Indications:              Dysphagia Medicines:                Monitored Anesthesia Care Procedure:                Pre-Anesthesia Assessment:                           - Prior to the procedure, a History and Physical                            was performed, and patient medications and                            allergies were reviewed. The patient's tolerance of                            previous anesthesia was also reviewed. The risks                            and benefits of the procedure and the sedation                            options and risks were discussed with the patient.                            All questions were answered, and informed consent                            was obtained. Prior Anticoagulants: The patient has                            taken no previous anticoagulant or antiplatelet                            agents. ASA Grade Assessment: II - A patient with                            mild systemic disease. After reviewing the risks                            and benefits, the patient was deemed in                            satisfactory condition to undergo the procedure.                           After obtaining informed consent, the endoscope was  passed under direct vision. Throughout the                            procedure, the patient's blood pressure, pulse, and                            oxygen saturations were monitored continuously. The                            Endoscope was introduced through the mouth, and                            advanced to the second part of duodenum. The upper                            GI endoscopy was accomplished without  difficulty.                            The patient tolerated the procedure well. Scope In: Scope Out: Findings:                 The Z-line was regular and was found 38 cm from the                            incisors.                           No endoscopic abnormality was evident in the                            esophagus to explain the patient's complaint of                            dysphagia. It was decided, however, to proceed with                            dilation of the lower third of the esophagus. A TTS                            dilator was passed through the scope. Dilation with                            an 18-19-20 mm x 8 cm CRE balloon dilator was                            performed to 20 mm. The dilation site was examined                            following endoscope reinsertion and showed no                            change.  A small hiatal hernia was present.                           Patchy mild inflammation characterized by                            congestion (edema) and erythema was found in the                            gastric antrum and in the prepyloric region of the                            stomach. Biopsies were taken with a cold forceps                            for Helicobacter pylori testing.                           The examined duodenum was normal. Complications:            No immediate complications. Estimated Blood Loss:     Estimated blood loss was minimal. Impression:               - Z-line regular, 38 cm from the incisors.                           - No endoscopic esophageal abnormality to explain                            patient's dysphagia. Esophagus dilated. Dilated.                           - Small hiatal hernia.                           - Gastritis. Biopsied.                           - Normal examined duodenum. Recommendation:           - Patient has a contact number available for                             emergencies. The signs and symptoms of potential                            delayed complications were discussed with the                            patient. Return to normal activities tomorrow.                            Written discharge instructions were provided to the                            patient.                           -  Resume previous diet.                           - Continue present medications.                           - Await pathology results.                           - Use Prilosec (omeprazole) 40 mg PO daily for 6                            months.                           - Follow an antireflux regimen. Mauri Pole, MD 04/04/2020 12:29:34 PM This report has been signed electronically.

## 2020-04-04 NOTE — Progress Notes (Signed)
PT taken to PACU. Monitors in place. VSS. Report given to RN. 

## 2020-04-07 ENCOUNTER — Other Ambulatory Visit: Payer: Self-pay

## 2020-04-07 ENCOUNTER — Inpatient Hospital Stay (HOSPITAL_COMMUNITY): Payer: Medicare HMO | Attending: Hematology

## 2020-04-07 DIAGNOSIS — M9903 Segmental and somatic dysfunction of lumbar region: Secondary | ICD-10-CM | POA: Diagnosis not present

## 2020-04-07 DIAGNOSIS — M47816 Spondylosis without myelopathy or radiculopathy, lumbar region: Secondary | ICD-10-CM | POA: Diagnosis not present

## 2020-04-07 DIAGNOSIS — D751 Secondary polycythemia: Secondary | ICD-10-CM | POA: Insufficient documentation

## 2020-04-07 DIAGNOSIS — E538 Deficiency of other specified B group vitamins: Secondary | ICD-10-CM | POA: Insufficient documentation

## 2020-04-07 DIAGNOSIS — E559 Vitamin D deficiency, unspecified: Secondary | ICD-10-CM | POA: Insufficient documentation

## 2020-04-07 DIAGNOSIS — Z803 Family history of malignant neoplasm of breast: Secondary | ICD-10-CM | POA: Diagnosis not present

## 2020-04-07 DIAGNOSIS — I10 Essential (primary) hypertension: Secondary | ICD-10-CM | POA: Diagnosis not present

## 2020-04-07 DIAGNOSIS — N400 Enlarged prostate without lower urinary tract symptoms: Secondary | ICD-10-CM | POA: Diagnosis not present

## 2020-04-07 DIAGNOSIS — G4730 Sleep apnea, unspecified: Secondary | ICD-10-CM | POA: Insufficient documentation

## 2020-04-07 DIAGNOSIS — Z87891 Personal history of nicotine dependence: Secondary | ICD-10-CM | POA: Diagnosis not present

## 2020-04-07 DIAGNOSIS — Z8249 Family history of ischemic heart disease and other diseases of the circulatory system: Secondary | ICD-10-CM | POA: Diagnosis not present

## 2020-04-07 DIAGNOSIS — D696 Thrombocytopenia, unspecified: Secondary | ICD-10-CM | POA: Insufficient documentation

## 2020-04-07 DIAGNOSIS — E039 Hypothyroidism, unspecified: Secondary | ICD-10-CM | POA: Insufficient documentation

## 2020-04-07 DIAGNOSIS — M545 Low back pain, unspecified: Secondary | ICD-10-CM | POA: Diagnosis not present

## 2020-04-07 DIAGNOSIS — Z79899 Other long term (current) drug therapy: Secondary | ICD-10-CM | POA: Insufficient documentation

## 2020-04-07 LAB — CBC WITH DIFFERENTIAL/PLATELET
Abs Immature Granulocytes: 0.01 10*3/uL (ref 0.00–0.07)
Basophils Absolute: 0.1 10*3/uL (ref 0.0–0.1)
Basophils Relative: 1 %
Eosinophils Absolute: 0.2 10*3/uL (ref 0.0–0.5)
Eosinophils Relative: 3 %
HCT: 55.6 % — ABNORMAL HIGH (ref 39.0–52.0)
Hemoglobin: 18.8 g/dL — ABNORMAL HIGH (ref 13.0–17.0)
Immature Granulocytes: 0 %
Lymphocytes Relative: 28 %
Lymphs Abs: 1.3 10*3/uL (ref 0.7–4.0)
MCH: 33.4 pg (ref 26.0–34.0)
MCHC: 33.8 g/dL (ref 30.0–36.0)
MCV: 98.8 fL (ref 80.0–100.0)
Monocytes Absolute: 0.5 10*3/uL (ref 0.1–1.0)
Monocytes Relative: 10 %
Neutro Abs: 2.7 10*3/uL (ref 1.7–7.7)
Neutrophils Relative %: 58 %
Platelets: 129 10*3/uL — ABNORMAL LOW (ref 150–400)
RBC: 5.63 MIL/uL (ref 4.22–5.81)
RDW: 12.8 % (ref 11.5–15.5)
WBC: 4.7 10*3/uL (ref 4.0–10.5)
nRBC: 0 % (ref 0.0–0.2)

## 2020-04-07 LAB — VITAMIN D 25 HYDROXY (VIT D DEFICIENCY, FRACTURES): Vit D, 25-Hydroxy: 74.39 ng/mL (ref 30–100)

## 2020-04-07 LAB — VITAMIN B12: Vitamin B-12: 827 pg/mL (ref 180–914)

## 2020-04-08 ENCOUNTER — Telehealth: Payer: Self-pay

## 2020-04-08 NOTE — Telephone Encounter (Signed)
  Follow up Call-  Call back number 04/04/2020 07/10/2019  Post procedure Call Back phone  # [phone]  Permission to leave phone message Yes Yes  Some recent data might be hidden     Patient questions:  Do you have a fever, pain , or abdominal swelling? No. Pain Score  0 *  Have you tolerated food without any problems? Yes.    Have you been able to return to your normal activities? Yes.    Do you have any questions about your discharge instructions: Diet   No. Medications  No. Follow up visit  No.  Do you have questions or concerns about your Care? No.  Actions: * If pain score is 4 or above: No action needed, pain <4. 1. Have you developed a fever since your procedure? no  2.   Have you had an respiratory symptoms (SOB or cough) since your procedure? no  3.   Have you tested positive for COVID 19 since your procedure no  4.   Have you had any family members/close contacts diagnosed with the COVID 19 since your procedure?  no   If yes to any of these questions please route to Joylene John, RN and Joella Prince, RN

## 2020-04-14 ENCOUNTER — Encounter: Payer: Self-pay | Admitting: Gastroenterology

## 2020-04-14 ENCOUNTER — Telehealth (HOSPITAL_COMMUNITY): Payer: Self-pay

## 2020-04-14 ENCOUNTER — Inpatient Hospital Stay (HOSPITAL_COMMUNITY): Payer: Medicare HMO | Admitting: Physician Assistant

## 2020-04-14 ENCOUNTER — Other Ambulatory Visit: Payer: Self-pay

## 2020-04-14 VITALS — BP 135/84 | HR 61 | Temp 97.1°F | Resp 18 | Wt 216.0 lb

## 2020-04-14 DIAGNOSIS — D696 Thrombocytopenia, unspecified: Secondary | ICD-10-CM

## 2020-04-14 DIAGNOSIS — D751 Secondary polycythemia: Secondary | ICD-10-CM | POA: Diagnosis not present

## 2020-04-14 DIAGNOSIS — Z87891 Personal history of nicotine dependence: Secondary | ICD-10-CM | POA: Diagnosis not present

## 2020-04-14 DIAGNOSIS — E559 Vitamin D deficiency, unspecified: Secondary | ICD-10-CM | POA: Diagnosis not present

## 2020-04-14 DIAGNOSIS — G4730 Sleep apnea, unspecified: Secondary | ICD-10-CM | POA: Diagnosis not present

## 2020-04-14 DIAGNOSIS — E538 Deficiency of other specified B group vitamins: Secondary | ICD-10-CM | POA: Diagnosis not present

## 2020-04-14 DIAGNOSIS — E039 Hypothyroidism, unspecified: Secondary | ICD-10-CM | POA: Diagnosis not present

## 2020-04-14 DIAGNOSIS — I10 Essential (primary) hypertension: Secondary | ICD-10-CM | POA: Diagnosis not present

## 2020-04-14 DIAGNOSIS — N400 Enlarged prostate without lower urinary tract symptoms: Secondary | ICD-10-CM | POA: Diagnosis not present

## 2020-04-14 NOTE — Patient Instructions (Signed)
[facility]  You were seen today by Tarri Abernethy PA-C for your low platelets and erythrocytosis.  Your low platelets are likely due to fatty liver infiltration, but is not causing significant issues at this time. Be on the lookout for any significant bleeding, and call our office if this occurs.  Consider cuting down on alcohol intake to prevent future liver damage.  Your blood count remains elevated due to your testosterone supplement. We recommend that you donate blood in the next 12 months, and can set ou up with an appointment with the [facility].   LABS: Return in 6 months for labs   OTHER TESTS: None  MEDICATIONS: No changes  FOLLOW-UP APPOINTMENT: 6 months (1 week after labs)   Thank you for choosing [facility] at [facility] to provide your oncology and hematology care.  To afford each patient quality time with our provider, please arrive at least 15 minutes before your scheduled appointment time.   If you have a lab appointment with the [facility] please come in thru the Main Entrance and check in at the main information desk.  You need to re-schedule your appointment should you arrive 10 or more minutes late.  We strive to give you quality time with our providers, and arriving late affects you and other patients whose appointments are after yours.  Also, if you no show three or more times for appointments you may be dismissed from the clinic at the providers discretion.     Again, thank you for choosing [facility]  Our hope is that these requests will decrease the amount of time that you wait before being seen by our physicians.       _____________________________________________________________  Should you have questions after your visit to [facility], please contact our office at [phone] and follow the prompts.  Our office hours are 8:00 a.m. and 4:30  p.m. Monday - Friday.  Please note that voicemails left after 4:00 p.m. may not be returned until the following business day.  We are closed weekends and major holidays.  You do have access to a nurse 24-7, just call the main number to the clinic [phone] and do not press any options, hold on the line and a nurse will answer the phone.    For prescription refill requests, have your pharmacy contact our office and allow 72 hours.    Due to Covid, you will need to wear a mask upon entering the hospital. If you do not have a mask, a mask will be given to you at the Main Entrance upon arrival. For doctor visits, patients may have 1 support person age 69 or older with them. For treatment visits, patients can not have anyone with them due to social distancing guidelines and our immunocompromised population.

## 2020-04-14 NOTE — Progress Notes (Signed)
Frank Noble, [place] [address]   CLINIC:  Medical Oncology/Hematology  PCP:  Doree Albee, MD [place] [address] [phone]   REASON FOR VISIT:  Follow-up for thrombocytopenia and erythrocytosis  CURRENT THERAPY: Observation  INTERVAL HISTORY:  Frank Noble 70 y.o. male returns for routine follow-up of his thrombocytopenia and erythrocytosis.  Frank Noble was last seen on 10/16/2019 by Dr. Delton Coombes.  Patient reports he has been doing well since his last visit.  Denies recent surgeries, infections, or recent hospitalizations.    Concerning his thrombocytopenia, he has not noticed any recent bleeding such as epistaxis, hematuria, hematemesis, melena, or hematochezia.  He does report that his gums occasionally bleed when he flosses, but that this resolves within 1-2 minutes. Denies excessive bleeding and easy brusing.  Regarding his elevated hemoglobin and erythrocytosis, patient denies recent thrombotic events.  No erythromelalgias, aquagenic pruritis, or changes in finger/toe coloration.  No new onset or worsening peripheral edema. No new neurologic symptoms such as new-onset hearing loss, blurred vision, headache, peripheral neuropathy, or dizziness.   Denies recent chest pain on exertion, shortness of breath on minimal exertion, pre-syncopal episodes, or palpitations.  He denies B-symptoms such as fever, chills, weight loss, and night sweats.  No new masses or lymphadenopathy per his report. He denies fatigue.    Patient reports appetite at  100% and energy level at  90%. He is maintaining stable weight at this time.   REVIEW OF SYSTEMS:  Review of Systems - Oncology    PAST MEDICAL/SURGICAL HISTORY:  Past Medical History:  Diagnosis Date  . Allergy   . Arthritis    back  . BPH (benign prostatic hyperplasia) 11/28/2018  . Cataract   . Colon adenomas 03/2016   5, polyps  . GERD (gastroesophageal reflux disease)    . Heart murmur   . HLD (hyperlipidemia) 11/28/2018  . Hypercholesteremia   . Hypertension   . 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 Sleep apnea    wears CPAP  . Thyroid disease    hypothyroid  . Vitamin D deficiency disease 11/28/2018   Past Surgical History:  Procedure Laterality Date  . COLONOSCOPY N/A 04/05/2016   Procedure: COLONOSCOPY;  Surgeon: [name], MD;  [facility]: [facility];  Service: Endoscopy;  Laterality: N/A;  8:30 Am  . COLONOSCOPY  07/10/2019   2018  . TONSILLECTOMY       SOCIAL HISTORY:  Social History   Socioeconomic History  . Marital status: Married    Spouse name: Not on file  . Number of children: 3  . Years of education: Not on file  . Highest education level: Not on file  Occupational History  . Occupation: retired  Tobacco Use  . Smoking status: Former Smoker    Packs/day: 0.50    Years: 10.00    Pack years: 5.00    Types: Cigarettes  . Smokeless tobacco: Never Used  . Tobacco comment: Quit age 46yrs  Vaping Use  . Vaping Use: Never used  Substance and Sexual Activity  . Alcohol use: Yes    Alcohol/week: 8.0 standard drinks    Types: 7 Cans of beer, 1 Shots of liquor per week    Comment: everyday, at least one beer a day  . Drug use: No  . Sexual activity: Not on file  Other Topics Concern  . Not on file  Social History Narrative         Married for  42 years.Lives with wife.Retired since Dec 2020..Originally from [place]in [place] years.    Social Determinants of Health   Financial Resource Strain: Not on file  Food Insecurity: Not on file  Transportation Needs: Not on file  Physical Activity: Not on file  Stress: Not on file  Social Connections: Not on file  Intimate Partner Violence: Not on file    FAMILY HISTORY:  Family History  Problem Relation Age of Onset  . Breast cancer Mother        Mets to liver  . High blood pressure Father   . Aneurysm Father   . Esophageal cancer Maternal Grandmother    . Colon cancer Neg Hx   . Rectal cancer Neg Hx   . Stomach cancer Neg Hx   . Colon polyps Neg Hx     CURRENT MEDICATIONS:  Outpatient Encounter Medications as of 04/14/2020  Medication Sig  . amLODipine (NORVASC) 5 MG tablet Take 1 tablet (5 mg total) by mouth daily.  Marland Kitchen atorvastatin (LIPITOR) 20 MG tablet TAKE 1 TABLET EVERY DAY  . Cetirizine HCl (ZYRTEC PO) Take by mouth as needed.  . Cholecalciferol (VITAMIN D-3) 5000 units TABS Take 10,000 Units by mouth daily.   . NP THYROID 60 MG tablet TAKE 1 TABLET BY MOUTH DAILY BEFORE BREAKFAST  . omeprazole (PRILOSEC) 20 MG capsule Take 1 capsule by mouth daily.  Marland Kitchen omeprazole (PRILOSEC) 40 MG capsule Take 1 capsule (40 mg total) by mouth daily.  . tadalafil (CIALIS) 5 MG tablet TAKE ONE TABLET BY MOUTH DAILY  . tamsulosin (FLOMAX) 0.4 MG CAPS capsule TAKE 1 CAPSULE DAILY (NEW)  . testosterone cypionate (DEPOTESTOSTERONE CYPIONATE) 200 MG/ML injection INJECT 0.5 MLS (100 MG TOTAL) INTO THE MUSCLE EVERY 7 (SEVEN) DAYS. INJECTS 0.5ML ON SUNDAYS.   Facility-Administered Encounter Medications as of 04/14/2020  Medication  . 0.9 %  sodium chloride infusion    ALLERGIES:  No Known Allergies   PHYSICAL EXAM:  ECOG PERFORMANCE STATUS: 0 - Asymptomatic  There were no vitals filed for this visit. There were no vitals filed for this visit. Physical Exam   LABORATORY DATA:  I have reviewed the labs as listed.  CBC    Component Value Date/Time   WBC 4.7 04/07/2020 1121   RBC 5.63 04/07/2020 1121   HGB 18.8 (H) 04/07/2020 1121   HCT 55.6 (H) 04/07/2020 1121   PLT 129 (L) 04/07/2020 1121   MCV 98.8 04/07/2020 1121   MCH 33.4 04/07/2020 1121   MCHC 33.8 04/07/2020 1121   RDW 12.8 04/07/2020 1121   LYMPHSABS 1.3 04/07/2020 1121   MONOABS 0.5 04/07/2020 1121   EOSABS 0.2 04/07/2020 1121   BASOSABS 0.1 04/07/2020 1121   CMP Latest Ref Rng & Units 02/26/2020 08/28/2019 04/17/2019  Glucose 65 - 139 mg/dL 88 88 100(H)  BUN 7 - 25 mg/dL 13 16  14   Creatinine 0.70 - 1.25 mg/dL 1.21 1.33(H) 1.29(H)  Sodium 135 - 146 mmol/L 143 141 140  Potassium 3.5 - 5.3 mmol/L 4.3 4.3 4.2  Chloride 98 - 110 mmol/L 108 105 103  CO2 20 - 32 mmol/L 28 29 28   Calcium 8.6 - 10.3 mg/dL 9.1 8.8 9.5  Total Protein 6.1 - 8.1 g/dL 5.9(L) 6.0(L) 6.4  Total Bilirubin 0.2 - 1.2 mg/dL 0.8 1.0 1.1  AST 10 - 35 U/L 25 28 26   ALT 9 - 46 U/L 33 35 33    DIAGNOSTIC IMAGING:  I have independently reviewed the relevant imaging and discussed  with the patient.  ASSESSMENT:   1. Mild thrombocytopenia: -CBC from 04/07/2020 shows platelet count of 129. This appears to be stable at the patient's baseline for the past several years.  -Denies any easy bruising or bleeding. -Ultrasound of the abdomen on 04/25/2019 shows normal spleen but the liver consistent with fatty infiltration.  -Labs on 09/26/2019 showed borderline B12, borderline elevated methylmalonic acid.  Folic acid, LDH, copper levels were normal.  ANA, rheumatoid factor negative.  Hepatitis and H. pylori nonreactive. -Suspect mild thrombocytopenia secondary to fatty liver infiltration.  Differential diagnosis also includes immune mediated thrombocytopenia versus early MDS.  2. Erythrocytosis: -CBC on 04/07/2020 shows hemoglobin 18.8 with hematocrit 55.6 -He has sleep apnea and uses CPAP for the last 10 years. He is a non-smoker. -He is on testosterone supplements for the last 2 to 3 years. -JAK2 V617F testing negative.   -Erythropoietin mildly elevated at 20.8, likely secondary to testosterone supplement (normal-appearing kidneys on abdominal ultrasound 04/25/19 with no evidence of cysts or benign tumors liver kidneys)  3. Heterozygous for H63D -Noted to be heterozygote for H63D hemochromatosis gene on 03/20/2020, which is a carrier state -Heterozygous state expected to show mildly increased iron, but not expected to show signs of organ damage secondary to iron overload -Iron panel (03/15/2020): Serum iron 209,  iron saturation 95%, ferritin 95  4.  Hepatic steatosis/fatty liver disease -Noted on abdominal ultrasound 04/25/2019 to have fatty liver infiltration, no splenomegaly -Follows with GI specialists -Counseled patient to cut back on alcohol intake (he drinks 1-2 beers per day), as he is at increased risk for further liver disease given his moderate alcohol intake and his status of heterozygosity for H63D (hemochromatosis carrier)  5.  Vitamin B12 deficiency -Takes daily vitamin B12 supplements -Vitamin B-12 (04/07/2020) normal at 827  6.  Vitamin D deficiency -Takes daily vitamin D supplement -Most recent Vitamin D (04/07/2020) normal at 74.39   PLAN:   1. Mild thrombocytopenia: -Suspect mild thrombocytopenia secondary to fatty liver infiltration.  Differential diagnosis also includes immune mediated thrombocytopenia versus early MDS. -No further work-up needed at this time.  Will reevaluate in 6 months with repeat labs.  2. Erythrocytosis: -CBC on 04/07/2020 shows hemoglobin 18.8 with hematocrit 55.6 -JAK2 test negative ruling out polycythemia.  Erythropoietin mildly elevated.  No evidence of cysts or benign tumors in the liver or kidneys on prior ultrasound of the abdomen. -Erythrocytosis from testosterone supplements.   -Recommend phlebotomy x1, patient to be set up with appointment with [facility] for blood donation -He will continue to follow with Dr. Anastasio Champion for blood work. -Repeat labs and return to clinic in 6 months  3. Heterozygous for H63D -Not a contributing factor to elevated hemoglobin -Most recent ferritin normal at 95 -Repeat iron labs in 6 months  4.  Hepatic steatosis/fatty liver disease  -Continue to follow with GI specialists -Counseled patient to cut back on alcohol intake (he drinks 1-2 beers per day), as he is at increased risk for further liver disease given his moderate alcohol intake and his status of heterozygosity for H63D (hemochromatosis carrier)  5.   Vitamin B12 deficiency -Continue daily vitamin B12 supplements -Vitamin B-12 (04/07/2020) normal at 827 -Recheck in 6 months  6.  Vitamin D deficiency -Continue daily vitamin D supplement -Most recent Vitamin D (04/07/2020) normal at 74.39 -Recheck in 6 months   PLAN SUMMARY & DISPOSITION: -Recommend phlebotomy x1 with [facility] for donation -Repeat labs in 6 months -Continue GI follow-up -Return to clinic in 6  months  All questions were answered. The patient knows to call the clinic with any problems, questions or concerns.  Medical decision making: Low  Time spent on visit: I spent 20 minutes counseling the patient face to face. The total time spent in the appointment was 25 minutes and more than 50% was on counseling.  The above plan was discussed with Dr. Delton Coombes, supervising physician, who agrees.  Harriett Rush, PA-C  04/14/20 8:59 AM

## 2020-04-14 NOTE — Telephone Encounter (Signed)
This nurse contacted patient about [facility] appointment.  This nurse advised patient to take order form with him to his scheduled appointment which he chose Thursday 04/17/20 at 1030 am.  Patient acknowledged understanding. No further questions or concerns at this time.

## 2020-04-16 ENCOUNTER — Other Ambulatory Visit (INDEPENDENT_AMBULATORY_CARE_PROVIDER_SITE_OTHER): Payer: Self-pay | Admitting: Internal Medicine

## 2020-04-21 DIAGNOSIS — M9903 Segmental and somatic dysfunction of lumbar region: Secondary | ICD-10-CM | POA: Diagnosis not present

## 2020-04-21 DIAGNOSIS — M47816 Spondylosis without myelopathy or radiculopathy, lumbar region: Secondary | ICD-10-CM | POA: Diagnosis not present

## 2020-04-21 DIAGNOSIS — M545 Low back pain, unspecified: Secondary | ICD-10-CM | POA: Diagnosis not present

## 2020-04-23 ENCOUNTER — Encounter (INDEPENDENT_AMBULATORY_CARE_PROVIDER_SITE_OTHER): Payer: Self-pay | Admitting: Internal Medicine

## 2020-05-05 DIAGNOSIS — M47816 Spondylosis without myelopathy or radiculopathy, lumbar region: Secondary | ICD-10-CM | POA: Diagnosis not present

## 2020-05-05 DIAGNOSIS — M9903 Segmental and somatic dysfunction of lumbar region: Secondary | ICD-10-CM | POA: Diagnosis not present

## 2020-05-05 DIAGNOSIS — M545 Low back pain, unspecified: Secondary | ICD-10-CM | POA: Diagnosis not present

## 2020-05-19 DIAGNOSIS — M9903 Segmental and somatic dysfunction of lumbar region: Secondary | ICD-10-CM | POA: Diagnosis not present

## 2020-05-19 DIAGNOSIS — M545 Low back pain, unspecified: Secondary | ICD-10-CM | POA: Diagnosis not present

## 2020-05-19 DIAGNOSIS — M47816 Spondylosis without myelopathy or radiculopathy, lumbar region: Secondary | ICD-10-CM | POA: Diagnosis not present

## 2020-05-23 NOTE — Progress Notes (Signed)
Subjective:   Frank Noble is a 70 y.o. male who presents for an Initial Medicare Annual Wellness Visit.  Review of Systems    N/A  Cardiac Risk Factors include: advanced age (>55men, >4 women);male gender;hypertension;dyslipidemia     Objective:    Today's Vitals   05/26/20 1035  BP: 130/70  Pulse: 69  Temp: 97.6 F (36.4 C)  TempSrc: Temporal  SpO2: 96%  Weight: 212 lb 8 oz (96.4 kg)  Height: 5\' 9"  (1.753 m)  PainSc: 3    Body mass index is 31.38 kg/m.  Advanced Directives 05/26/2020 04/14/2020 10/16/2019 09/26/2019 04/05/2016  Does Patient Have a Medical Advance Directive? Yes Yes Yes Yes Yes  Type of Paramedic of [place];Living will [place];Living will Healthcare Power of [place];Living will Living will;Healthcare Power of Attorney  Does patient want to make changes to medical advance directive? No - Patient declined No - Patient declined No - Patient declined No - Patient declined -  Copy of [place] in Chart? No - copy requested No - copy requested No - copy requested No - copy requested -    Current Medications (verified) Outpatient Encounter Medications as of 05/26/2020  Medication Sig  . amLODipine (NORVASC) 5 MG tablet Take 1 tablet (5 mg total) by mouth daily.  Marland Kitchen atorvastatin (LIPITOR) 20 MG tablet TAKE 1 TABLET EVERY DAY  . Cetirizine HCl (ZYRTEC PO) Take by mouth as needed.  . Cholecalciferol (VITAMIN D-3) 5000 units TABS Take 10,000 Units by mouth daily.   . NP THYROID 60 MG tablet TAKE 1 TABLET BY MOUTH DAILY BEFORE BREAKFAST  . omeprazole (PRILOSEC) 40 MG capsule Take 1 capsule (40 mg total) by mouth daily.  . tadalafil (CIALIS) 5 MG tablet TAKE ONE TABLET BY MOUTH DAILY  . tamsulosin (FLOMAX) 0.4 MG CAPS capsule TAKE 1 CAPSULE DAILY (NEW)  . testosterone cypionate (DEPOTESTOSTERONE CYPIONATE) 200 MG/ML injection INJECT 0.5 MLS (100 MG TOTAL) INTO THE MUSCLE EVERY  7 (SEVEN) DAYS. INJECTS 0.5ML ON SUNDAYS.   Facility-Administered Encounter Medications as of 05/26/2020  Medication  . 0.9 %  sodium chloride infusion    Allergies (verified) Pollen extract-tree extract [pollen extract]   History: Past Medical History:  Diagnosis Date  . Allergy   . Arthritis    back  . BPH (benign prostatic hyperplasia) 11/28/2018  . Cataract   . Colon adenomas 03/2016   5, polyps  . GERD (gastroesophageal reflux disease)   . Heart murmur   . HLD (hyperlipidemia) 11/28/2018  . Hypercholesteremia   . Hypertension   . 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 Sleep apnea    wears CPAP  . Thyroid disease    hypothyroid  . Vitamin D deficiency disease 11/28/2018   Past Surgical History:  Procedure Laterality Date  . COLONOSCOPY N/A 04/05/2016   Procedure: COLONOSCOPY;  Surgeon: [name], MD;  [facility];  Service: Endoscopy;  Laterality: N/A;  8:30 Am  . COLONOSCOPY  07/10/2019   2018  . TONSILLECTOMY     Family History  Problem Relation Age of Onset  . Breast cancer Mother        Mets to liver  . High blood pressure Father   . Aneurysm Father   . Esophageal cancer Maternal Grandmother   . Colon cancer Neg Hx   . Rectal cancer Neg Hx   . Stomach cancer Neg Hx   . Colon polyps Neg Hx  Social History   Socioeconomic History  . Marital status: Married    Spouse name: Not on file  . Number of children: 3  . Years of education: Not on file  . Highest education level: Not on file  Occupational History  . Occupation: retired  Tobacco Use  . Smoking status: Former Smoker    Packs/day: 0.50    Years: 10.00    Pack years: 5.00    Types: Cigarettes  . Smokeless tobacco: Never Used  . Tobacco comment: Quit age 13yrs  Vaping Use  . Vaping Use: Never used  Substance and Sexual Activity  . Alcohol use: Yes    Alcohol/week: 8.0 standard drinks    Types: 7 Cans of beer, 1 Shots of liquor per week    Comment: everyday, at least  one beer a day  . Drug use: No  . Sexual activity: Not on file  Other Topics Concern  . Not on file  Social History Narrative         Married for 42 years.Lives with wife.Retired since Dec 2020..Originally from [place],in [place] for [place] years.    Social Determinants of Health   Financial Resource Strain: Low Risk   . Difficulty of Paying Living Expenses: Not hard at all  Food Insecurity: No Food Insecurity  . Worried About Charity fundraiser in the Last Year: Never true  . Ran Out of Food in the Last Year: Never true  Transportation Needs: No Transportation Needs  . Lack of Transportation (Medical): No  . Lack of Transportation (Non-Medical): No  Physical Activity: Insufficiently Active  . Days of Exercise per Week: 3 days  . Minutes of Exercise per Session: 30 min  Stress: No Stress Concern Present  . Feeling of Stress : Only a little  Social Connections: Moderately Isolated  . Frequency of Communication with Friends and Family: More than three times a week  . Frequency of Social Gatherings with Friends and Family: Never  . Attends Religious Services: Never  . Active Member of Clubs or Organizations: No  . Attends Archivist Meetings: Never  . Marital Status: Married    Tobacco Counseling Counseling given: Not Answered Comment: Quit age 37yrs   Clinical Intake:  Pre-visit preparation completed: Yes  Pain : 0-10 Pain Score: 3  Pain Type: Chronic pain Pain Location: Back Pain Orientation: Lower Pain Descriptors / Indicators: Aching Pain Onset: More than a month ago Pain Frequency: Intermittent Pain Relieving Factors: standing, and sitting  Pain Relieving Factors: standing, and sitting  Nutritional Risks: None Diabetes: No  How often do you need to have someone help you when you read instructions, pamphlets, or other written materials from your doctor or pharmacy?: 1 - Never  Diabetic?No  Interpreter Needed?: No  Information entered by ::  [place] of Daily Living In your present state of health, do you have any difficulty performing the following activities: 05/26/2020  Hearing? Y  Comment has significant hearing loss to left ear  Vision? N  Difficulty concentrating or making decisions? N  Walking or climbing stairs? N  Dressing or bathing? N  Doing errands, shopping? N  Preparing Food and eating ? N  Using the Toilet? N  In the past six months, have you accidently leaked urine? N  Do you have problems with loss of bowel control? N  Managing your Medications? N  Managing your Finances? N  Housekeeping or managing your Housekeeping? N  Some recent data might be hidden  Patient Care Team: Doree Albee, MD as PCP - General (Internal Medicine)  Indicate any recent Medical Services you may have received from other than [facility] providers in the past year (date may be approximate).     Assessment:   This is a routine wellness examination for [place].  Hearing/Vision screen  Hearing Screening   125Hz  250Hz  500Hz  1000Hz  2000Hz  3000Hz  4000Hz  6000Hz  8000Hz   Right ear:           Left ear:           Vision Screening Comments: Patient states gets annual eye exams once per year. Currently wears glasses. Has bilateral cataracts not ready for surgical repair   Dietary issues and exercise activities discussed: Current Exercise Habits: Home exercise routine, Type of exercise: walking, Time (Minutes): 30, Frequency (Times/Week): 3, Weekly Exercise (Minutes/Week): 90, Intensity: Moderate  Goals Addressed            This Visit's Progress   . Exercise 3x per week (30 min per time)       I would like to walk a mile 3x per week    . Weight (lb) < 200 lb (90.7 kg)   212 lb 8 oz (96.4 kg)     Depression Screen PHQ 2/9 Scores 05/26/2020 11/28/2018  PHQ - 2 Score 0 0    Fall Risk Fall Risk  05/26/2020  Falls in the past year? 0  Number falls in past yr: 0  Injury with Fall? 0  Risk for fall due to : No Fall  Risks  Follow up Falls evaluation completed;Falls prevention discussed    FALL RISK PREVENTION PERTAINING TO THE HOME:  Any stairs in or around the home? No  If so, are there any without handrails? No  Home free of loose throw rugs in walkways, pet beds, electrical cords, etc? Yes  Adequate lighting in your home to reduce risk of falls? Yes   ASSISTIVE DEVICES UTILIZED TO PREVENT FALLS:  Life alert? No  Use of a cane, walker or w/c? No  Grab bars in the bathroom? No  Shower chair or bench in shower? Yes  Elevated toilet seat or a handicapped toilet? Yes   TIMED UP AND GO:  Was the test performed? Yes .  Length of time to ambulate 10 feet: 3 sec.   Gait steady and fast without use of assistive device  Cognitive Function:   Normal cognitive status assessed by direct observation by this Nurse Health Advisor. No abnormalities found.        Immunizations Immunization History  Administered Date(s) Administered  . Influenza-Unspecified 10/18/2016, 10/14/2019  . Moderna SARS-COV2 Booster Vaccination 04/21/2020  . Moderna Sars-Covid-2 Vaccination 02/08/2019, 03/16/2019, 10/03/2019  . Pneumococcal Conjugate-13 10/27/2017  . Pneumococcal Polysaccharide-23 01/13/2017    TDAP status: Due, Education has been provided regarding the importance of this vaccine. Advised may receive this vaccine at local pharmacy or Health Dept. Aware to provide a copy of the vaccination record if obtained from local pharmacy or Health Dept. Verbalized acceptance and understanding.  Flu Vaccine status: Up to date  Pneumococcal vaccine status: Up to date  Covid-19 vaccine status: Completed vaccines  Qualifies for Shingles Vaccine? Yes   Zostavax completed No   Shingrix Completed?: No.    Education has been provided regarding the importance of this vaccine. Patient has been advised to call insurance company to determine out of pocket expense if they have not yet received this vaccine. Advised may also  receive vaccine at local pharmacy  or Health Dept. Verbalized acceptance and understanding.  Screening Tests Health Maintenance  Topic Date Due  . INFLUENZA VACCINE  08/18/2020  . COLONOSCOPY (Pts 45-52yrs Insurance coverage will need to be confirmed)  07/10/2022  . TETANUS/TDAP  01/14/2027  . COVID-19 Vaccine  Completed  . Hepatitis C Screening  Completed  . PNA vac Low Risk Adult  Completed  . HPV VACCINES  Aged Out    Health Maintenance  There are no preventive care reminders to display for this patient.  Colorectal cancer screening: Type of screening: Colonoscopy. Completed 07/10/2019. Repeat every 3 years  Lung Cancer Screening: (Low Dose CT Chest recommended if Age 7-80 years, 30 pack-year currently smoking OR have quit w/in 15years.) does not qualify.   Lung Cancer Screening Referral: N/A   Additional Screening:  Hepatitis C Screening: does qualify; Completed 11/28/2018   Vision Screening: Recommended annual ophthalmology exams for early detection of glaucoma and other disorders of the eye. Is the patient up to date with their annual eye exam?  Yes  Who is the provider or what is the name of the office in which the patient attends annual eye exams? [facility] If pt is not established with a provider, would they like to be referred to a provider to establish care? No .   Dental Screening: Recommended annual dental exams for proper oral hygiene  Community Resource Referral / Chronic Care Management: CRR required this visit?  No   CCM required this visit?  No      Plan:     I have personally reviewed and noted the following in the patient's chart:   . Medical and social history . Use of alcohol, tobacco or illicit drugs  . Current medications and supplements including opioid prescriptions. Patient is not currently taking opioid prescriptions. . Functional ability and status . Nutritional status . Physical activity . Advanced directives . List of other  physicians . Hospitalizations, surgeries, and ER visits in previous 12 months . Vitals . Screenings to include cognitive, depression, and falls . Referrals and appointments  In addition, I have reviewed and discussed with patient certain preventive protocols, quality metrics, and best practice recommendations. A written personalized care plan for preventive services as well as general preventive health recommendations were provided to patient.     Ofilia Neas, LPN   06/21/4032   Nurse Notes: None

## 2020-05-26 ENCOUNTER — Other Ambulatory Visit: Payer: Self-pay

## 2020-05-26 ENCOUNTER — Encounter (INDEPENDENT_AMBULATORY_CARE_PROVIDER_SITE_OTHER): Payer: Self-pay

## 2020-05-26 ENCOUNTER — Ambulatory Visit (INDEPENDENT_AMBULATORY_CARE_PROVIDER_SITE_OTHER): Payer: Medicare HMO

## 2020-05-26 VITALS — BP 130/70 | HR 69 | Temp 97.6°F | Ht 69.0 in | Wt 212.5 lb

## 2020-05-26 DIAGNOSIS — Z0000 Encounter for general adult medical examination without abnormal findings: Secondary | ICD-10-CM

## 2020-05-26 NOTE — Patient Instructions (Addendum)
Frank Noble , Thank you for taking time to come for your Medicare Wellness Visit. I appreciate your ongoing commitment to your health goals. Please review the following plan we discussed and let me know if I can assist you in the future.   Screening recommendations/referrals: Colonoscopy: Up to date, next due 07/10/2022 Recommended yearly ophthalmology/optometry visit for glaucoma screening and checkup Recommended yearly dental visit for hygiene and checkup  Vaccinations: Influenza vaccine: Up to date, next due fall 2022 Pneumococcal vaccine: Completed series  Tdap vaccine: Currently due, you may await and injury  Shingles vaccine: Currently due for shingrix, if you would like to receive we recommend that you do so at your local pharmacy.    Advanced directives: Please bring in copies of your advanced medical directives so that we may scan into your chart.  Conditions/risks identified: None   Next appointment: 06/25/2020 @ 9 am with Dr. Anastasio Champion  Preventive Care 65 Years and Older, Male Preventive care refers to lifestyle choices and visits with your health care provider that can promote health and wellness. What does preventive care include?  A yearly physical exam. This is also called an annual well check.  Dental exams once or twice a year.  Routine eye exams. Ask your health care provider how often you should have your eyes checked.  Personal lifestyle choices, including:  Daily care of your teeth and gums.  Regular physical activity.  Eating a healthy diet.  Avoiding tobacco and drug use.  Limiting alcohol use.  Practicing safe sex.  Taking low doses of aspirin every day.  Taking vitamin and mineral supplements as recommended by your health care provider. What happens during an annual well check? The services and screenings done by your health care provider during your annual well check will depend Noble your age, overall health, lifestyle risk factors, and family  history of disease. Counseling  Your health care provider may ask you questions about your:  Alcohol use.  Tobacco use.  Drug use.  Emotional well-being.  Home and relationship well-being.  Sexual activity.  Eating habits.  History of falls.  Memory and ability to understand (cognition).  Work and work Statistician. Screening  You may have the following tests or measurements:  Height, weight, and BMI.  Blood pressure.  Lipid and cholesterol levels. These may be checked every 5 years, or more frequently if you are over 11 years old.  Skin check.  Lung cancer screening. You may have this screening every year starting at age 32 if you have a 30-pack-year history of smoking and currently smoke or have quit within the past 15 years.  Fecal occult blood test (FOBT) of the stool. You may have this test every year starting at age 51.  Flexible sigmoidoscopy or colonoscopy. You may have a sigmoidoscopy every 5 years or a colonoscopy every 10 years starting at age 28.  Prostate cancer screening. Recommendations will vary depending Noble your family history and other risks.  Hepatitis C blood test.  Hepatitis B blood test.  Sexually transmitted disease (STD) testing.  Diabetes screening. This is done by checking your blood sugar (glucose) after you have not eaten for a while (fasting). You may have this done every 1-3 years.  Abdominal aortic aneurysm (AAA) screening. You may need this if you are a current or former smoker.  Osteoporosis. You may be screened starting at age 41 if you are at high risk. Talk with your health care provider about your test results, treatment options, and if  necessary, the need for more tests. Vaccines  Your health care provider may recommend certain vaccines, such as:  Influenza vaccine. This is recommended every year.  Tetanus, diphtheria, and acellular pertussis (Tdap, Td) vaccine. You may need a Td booster every 10 years.  Zoster vaccine.  You may need this after age 75.  Pneumococcal 13-valent conjugate (PCV13) vaccine. One dose is recommended after age 59.  Pneumococcal polysaccharide (PPSV23) vaccine. One dose is recommended after age 83. Talk to your health care provider about which screenings and vaccines you need and how often you need them. This information is not intended to replace advice given to you by your health care provider. Make sure you discuss any questions you have with your health care provider. Document Released: 01/31/2015 Document Revised: 09/24/2015 Document Reviewed: 11/05/2014 Elsevier Interactive Patient Education  2017 [place] Prevention in the Home Falls can cause injuries. They can happen to people of all ages. There are many things you can do to make your home safe and to help prevent falls. What can I do Noble the outside of my home?  Regularly fix the edges of walkways and driveways and fix any cracks.  Remove anything that might make you trip as you walk through a door, such as a raised step or threshold.  Trim any bushes or trees Noble the path to your home.  Use bright outdoor lighting.  Clear any walking paths of anything that might make someone trip, such as rocks or tools.  Regularly check to see if handrails are loose or broken. Make sure that both sides of any steps have handrails.  Any raised decks and porches should have guardrails Noble the edges.  Have any leaves, snow, or ice cleared regularly.  Use sand or salt Noble walking paths during winter.  Clean up any spills in your garage right away. This includes oil or grease spills. What can I do in the bathroom?  Use night lights.  Install grab bars by the toilet and in the tub and shower. Do not use towel bars as grab bars.  Use non-skid mats or decals in the tub or shower.  If you need to sit down in the shower, use a plastic, non-slip stool.  Keep the floor dry. Clean up any water that spills Noble the floor as soon  as it happens.  Remove soap buildup in the tub or shower regularly.  Attach bath mats securely with double-sided non-slip rug tape.  Do not have throw rugs and other things Noble the floor that can make you trip. What can I do in the bedroom?  Use night lights.  Make sure that you have a light by your bed that is easy to reach.  Do not use any sheets or blankets that are too big for your bed. They should not hang down onto the floor.  Have a firm chair that has side arms. You can use this for support while you get dressed.  Do not have throw rugs and other things Noble the floor that can make you trip. What can I do in the kitchen?  Clean up any spills right away.  Avoid walking Noble wet floors.  Keep items that you use a lot in easy-to-reach places.  If you need to reach something above you, use a strong step stool that has a grab bar.  Keep electrical cords out of the way.  Do not use floor polish or wax that makes floors slippery. If you must  use wax, use non-skid floor wax.  Do not have throw rugs and other things Noble the floor that can make you trip. What can I do with my stairs?  Do not leave any items Noble the stairs.  Make sure that there are handrails Noble both sides of the stairs and use them. Fix handrails that are broken or loose. Make sure that handrails are as long as the stairways.  Check any carpeting to make sure that it is firmly attached to the stairs. Fix any carpet that is loose or worn.  Avoid having throw rugs at the top or bottom of the stairs. If you do have throw rugs, attach them to the floor with carpet tape.  Make sure that you have a light switch at the top of the stairs and the bottom of the stairs. If you do not have them, ask someone to add them for you. What else can I do to help prevent falls?  Wear shoes that:  Do not have high heels.  Have rubber bottoms.  Are comfortable and fit you well.  Are closed at the toe. Do not wear sandals.  If  you use a stepladder:  Make sure that it is fully opened. Do not climb a closed stepladder.  Make sure that both sides of the stepladder are locked into place.  Ask someone to hold it for you, if possible.  Clearly mark and make sure that you can see:  Any grab bars or handrails.  First and last steps.  Where the edge of each step is.  Use tools that help you move around (mobility aids) if they are needed. These include:  Canes.  Walkers.  Scooters.  Crutches.  Turn Noble the lights when you go into a dark area. Replace any light bulbs as soon as they burn out.  Set up your furniture so you have a clear path. Avoid moving your furniture around.  If any of your floors are uneven, fix them.  If there are any pets around you, be aware of where they are.  Review your medicines with your doctor. Some medicines can make you feel dizzy. This can increase your chance of falling. Ask your doctor what other things that you can do to help prevent falls. This information is not intended to replace advice given to you by your health care provider. Make sure you discuss any questions you have with your health care provider. Document Released: 10/31/2008 Document Revised: 06/12/2015 Document Reviewed: 02/08/2014 Elsevier Interactive Patient Education  2017 [employer].

## 2020-06-02 DIAGNOSIS — M9903 Segmental and somatic dysfunction of lumbar region: Secondary | ICD-10-CM | POA: Diagnosis not present

## 2020-06-02 DIAGNOSIS — M47816 Spondylosis without myelopathy or radiculopathy, lumbar region: Secondary | ICD-10-CM | POA: Diagnosis not present

## 2020-06-02 DIAGNOSIS — M545 Low back pain, unspecified: Secondary | ICD-10-CM | POA: Diagnosis not present

## 2020-06-16 ENCOUNTER — Other Ambulatory Visit (INDEPENDENT_AMBULATORY_CARE_PROVIDER_SITE_OTHER): Payer: Self-pay | Admitting: Internal Medicine

## 2020-06-16 DIAGNOSIS — I10 Essential (primary) hypertension: Secondary | ICD-10-CM

## 2020-06-17 DIAGNOSIS — M47816 Spondylosis without myelopathy or radiculopathy, lumbar region: Secondary | ICD-10-CM | POA: Diagnosis not present

## 2020-06-17 DIAGNOSIS — M9903 Segmental and somatic dysfunction of lumbar region: Secondary | ICD-10-CM | POA: Diagnosis not present

## 2020-06-17 DIAGNOSIS — M545 Low back pain, unspecified: Secondary | ICD-10-CM | POA: Diagnosis not present

## 2020-06-25 ENCOUNTER — Other Ambulatory Visit: Payer: Self-pay

## 2020-06-25 ENCOUNTER — Ambulatory Visit (INDEPENDENT_AMBULATORY_CARE_PROVIDER_SITE_OTHER): Payer: Medicare HMO | Admitting: Internal Medicine

## 2020-06-25 ENCOUNTER — Encounter (INDEPENDENT_AMBULATORY_CARE_PROVIDER_SITE_OTHER): Payer: Self-pay | Admitting: Internal Medicine

## 2020-06-25 VITALS — BP 128/70 | HR 87 | Temp 97.6°F | Resp 18 | Ht 69.0 in | Wt 213.5 lb

## 2020-06-25 DIAGNOSIS — I10 Essential (primary) hypertension: Secondary | ICD-10-CM

## 2020-06-25 DIAGNOSIS — E782 Mixed hyperlipidemia: Secondary | ICD-10-CM

## 2020-06-25 DIAGNOSIS — E291 Testicular hypofunction: Secondary | ICD-10-CM | POA: Diagnosis not present

## 2020-06-25 DIAGNOSIS — M545 Low back pain, unspecified: Secondary | ICD-10-CM

## 2020-06-25 DIAGNOSIS — G8929 Other chronic pain: Secondary | ICD-10-CM

## 2020-06-25 NOTE — Progress Notes (Signed)
Metrics: Intervention Frequency ACO  Documented Smoking Status Yearly  Screened one or more times in 24 months  Cessation Counseling or  Active cessation medication Past 24 months  Past 24 months   Guideline developer: UpToDate (See UpToDate for funding source) Date Released: 2014       Wellness Office Visit  Subjective:  Patient ID: Frank Noble, male    DOB: 04/10/1950  Age: 70 y.o. MRN: [id number]  CC: This man comes in for follow-up of hypertension, hypogonadism, dyslipidemia and chronic low back pain. HPI  He continues with chronic low back pain and he is willing to put up with it for the time being without further interventions. He continues on NP thyroid 60 mg daily, off label, for symptoms of thyroid hypofunction. He continues on testosterone therapy as before and is tolerating this well. He continues on statin therapy.  He has fatty liver disease which might explain his thrombocytopenia.  His erythrocytosis is due to testosterone therapy and is not associated with development of thromboembolic disease based on the medical literature. Past Medical History:  Diagnosis Date  . Allergy   . Arthritis    back  . BPH (benign prostatic hyperplasia) 11/28/2018  . Cataract   . Colon adenomas 03/2016   5, polyps  . GERD (gastroesophageal reflux disease)   . Heart murmur   . HLD (hyperlipidemia) 11/28/2018  . Hypercholesteremia   . Hypertension   . 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 Sleep apnea    wears CPAP  . Thyroid disease    hypothyroid  . Vitamin D deficiency disease 11/28/2018   Past Surgical History:  Procedure Laterality Date  . COLONOSCOPY N/A 04/05/2016   Procedure: COLONOSCOPY;  Surgeon: [name], MD;  Location: [facility];  Service: Endoscopy;  Laterality: N/A;  8:30 Am  . COLONOSCOPY  07/10/2019   2018  . TONSILLECTOMY       Family History  Problem Relation Age of Onset  . Breast cancer Mother        Mets to liver  . High blood  pressure Father   . Aneurysm Father   . Esophageal cancer Maternal Grandmother   . Colon cancer Neg Hx   . Rectal cancer Neg Hx   . Stomach cancer Neg Hx   . Colon polyps Neg Hx     Social History   Social History Narrative         Married for 42 years.Lives with wife.Retired since Dec 2020..Originally from [place],in [place] years.    Social History   Tobacco Use  . Smoking status: Former Smoker    Packs/day: 0.50    Years: 10.00    Pack years: 5.00    Types: Cigarettes  . Smokeless tobacco: Never Used  . Tobacco comment: Quit age 62yrs  Substance Use Topics  . Alcohol use: Yes    Alcohol/week: 8.0 standard drinks    Types: 7 Cans of beer, 1 Shots of liquor per week    Comment: everyday, at least one beer a day    Current Meds  Medication Sig  . amLODipine (NORVASC) 5 MG tablet TAKE 1 TABLET (5 MG TOTAL) BY MOUTH DAILY.  Marland Kitchen atorvastatin (LIPITOR) 20 MG tablet TAKE 1 TABLET EVERY DAY  . Cetirizine HCl (ZYRTEC PO) Take by mouth as needed.  . Cholecalciferol (VITAMIN D-3) 5000 units TABS Take 10,000 Units by mouth daily.   . NP THYROID 60 MG tablet TAKE 1 TABLET BY MOUTH DAILY BEFORE BREAKFAST  .  omeprazole (PRILOSEC) 40 MG capsule Take 1 capsule (40 mg total) by mouth daily.  . tadalafil (CIALIS) 5 MG tablet TAKE ONE TABLET BY MOUTH DAILY  . tamsulosin (FLOMAX) 0.4 MG CAPS capsule TAKE 1 CAPSULE DAILY  . testosterone cypionate (DEPOTESTOSTERONE CYPIONATE) 200 MG/ML injection INJECT 0.5 MLS (100 MG TOTAL) INTO THE MUSCLE EVERY 7 (SEVEN) DAYS. INJECTS 0.5ML ON SUNDAYS.       Objective:   Today's Vitals: BP 128/70 (BP Location: Right Arm, Patient Position: Sitting, Cuff Size: Normal)   Pulse 87   Temp 97.6 F (36.4 C) (Temporal)   Resp 18   Ht 5\' 9"  (1.753 m)   Wt 213 lb 8 oz (96.8 kg)   SpO2 98%   BMI 31.53 kg/m  Vitals with BMI 06/25/2020 05/26/2020 04/14/2020  Height 5\' 9"  5\' 9"  -  Weight 213 lbs 8 oz 212 lbs 8 oz 216 lbs  BMI 31.51 80.99 83.38  Systolic  250 539 767  Diastolic 70 70 84  Pulse 87 69 61     Physical Exam  He looks systemically well.  Weight is unchanged.  Blood pressure is in good control.     Assessment   1. Mixed hyperlipidemia   2. Primary testicular failure   3. Essential hypertension, benign   4. Chronic midline low back pain without sciatica       Tests ordered No orders of the defined types were placed in this encounter.    Plan: 1. After a full discussion and shared decision making, we decided to discontinue Lipitor.  I discussed the minimal benefits in terms of event protection, the increased development of insulin resistance and fatty liver. 2. We will optimize thyroid and I have given him NP thyroid 15 mg tablets which she will continue to escalate so he will eventually be taking NP thyroid 60 mg twice away at which point I can give him a prescription. 3. Continue with testosterone therapy as before. 4. Continue with amlodipine for hypertension as before.  This is controlling his blood pressure quite well. 5. I will see him in September for an annual physical exam.   No orders of the defined types were placed in this encounter.   Doree Albee, MD

## 2020-07-01 DIAGNOSIS — M545 Low back pain, unspecified: Secondary | ICD-10-CM | POA: Diagnosis not present

## 2020-07-01 DIAGNOSIS — M9903 Segmental and somatic dysfunction of lumbar region: Secondary | ICD-10-CM | POA: Diagnosis not present

## 2020-07-01 DIAGNOSIS — M47816 Spondylosis without myelopathy or radiculopathy, lumbar region: Secondary | ICD-10-CM | POA: Diagnosis not present

## 2020-07-14 ENCOUNTER — Encounter (INDEPENDENT_AMBULATORY_CARE_PROVIDER_SITE_OTHER): Payer: Self-pay | Admitting: Internal Medicine

## 2020-07-15 ENCOUNTER — Other Ambulatory Visit (INDEPENDENT_AMBULATORY_CARE_PROVIDER_SITE_OTHER): Payer: Self-pay | Admitting: Internal Medicine

## 2020-07-15 DIAGNOSIS — M47816 Spondylosis without myelopathy or radiculopathy, lumbar region: Secondary | ICD-10-CM | POA: Diagnosis not present

## 2020-07-15 DIAGNOSIS — M9903 Segmental and somatic dysfunction of lumbar region: Secondary | ICD-10-CM | POA: Diagnosis not present

## 2020-07-15 DIAGNOSIS — M545 Low back pain, unspecified: Secondary | ICD-10-CM | POA: Diagnosis not present

## 2020-07-15 MED ORDER — NP THYROID 90 MG PO TABS: 90.0000 mg | ORAL_TABLET | Freq: Every day | ORAL | 1 refills | Status: AC

## 2020-07-15 NOTE — Telephone Encounter (Signed)
Need refills np thyroid meds to [facility] in [place]

## 2020-08-05 DIAGNOSIS — M9903 Segmental and somatic dysfunction of lumbar region: Secondary | ICD-10-CM | POA: Diagnosis not present

## 2020-08-05 DIAGNOSIS — M545 Low back pain, unspecified: Secondary | ICD-10-CM | POA: Diagnosis not present

## 2020-08-05 DIAGNOSIS — M47816 Spondylosis without myelopathy or radiculopathy, lumbar region: Secondary | ICD-10-CM | POA: Diagnosis not present

## 2020-08-19 DIAGNOSIS — M545 Low back pain, unspecified: Secondary | ICD-10-CM | POA: Diagnosis not present

## 2020-08-19 DIAGNOSIS — M9903 Segmental and somatic dysfunction of lumbar region: Secondary | ICD-10-CM | POA: Diagnosis not present

## 2020-08-19 DIAGNOSIS — M47816 Spondylosis without myelopathy or radiculopathy, lumbar region: Secondary | ICD-10-CM | POA: Diagnosis not present

## 2020-09-02 ENCOUNTER — Telehealth: Payer: Self-pay

## 2020-09-02 DIAGNOSIS — M9903 Segmental and somatic dysfunction of lumbar region: Secondary | ICD-10-CM | POA: Diagnosis not present

## 2020-09-02 DIAGNOSIS — M47816 Spondylosis without myelopathy or radiculopathy, lumbar region: Secondary | ICD-10-CM | POA: Diagnosis not present

## 2020-09-02 DIAGNOSIS — M545 Low back pain, unspecified: Secondary | ICD-10-CM | POA: Diagnosis not present

## 2020-09-02 NOTE — Telephone Encounter (Signed)
Per Dr Posey Pronto, we will not take this pt due to currently on Hormone Therapy.  Not to PCP @ [facility]

## 2020-09-16 DIAGNOSIS — M9903 Segmental and somatic dysfunction of lumbar region: Secondary | ICD-10-CM | POA: Diagnosis not present

## 2020-09-16 DIAGNOSIS — M545 Low back pain, unspecified: Secondary | ICD-10-CM | POA: Diagnosis not present

## 2020-09-16 DIAGNOSIS — M47816 Spondylosis without myelopathy or radiculopathy, lumbar region: Secondary | ICD-10-CM | POA: Diagnosis not present

## 2020-09-24 ENCOUNTER — Telehealth: Payer: Self-pay

## 2020-09-24 NOTE — Telephone Encounter (Signed)
The patient has been on Prilosec 40 mg for 6 months now. He reports he is doing well and does not have any GI symptoms or concerns. Does he decrease to Prilosec 20 mg or d/c the Prilosec completely?

## 2020-09-24 NOTE — Telephone Encounter (Signed)
Patient notified

## 2020-09-24 NOTE — Telephone Encounter (Signed)
Please advise patient to decrease the dose of omeprazole to '20mg'$  daily and use as needed. Thanks

## 2020-09-25 ENCOUNTER — Other Ambulatory Visit: Payer: Self-pay

## 2020-09-25 MED ORDER — OMEPRAZOLE 20 MG PO CPDR: DELAYED_RELEASE_CAPSULE | ORAL | 3 refills | Status: DC

## 2020-09-30 DIAGNOSIS — M545 Low back pain, unspecified: Secondary | ICD-10-CM | POA: Diagnosis not present

## 2020-09-30 DIAGNOSIS — M9903 Segmental and somatic dysfunction of lumbar region: Secondary | ICD-10-CM | POA: Diagnosis not present

## 2020-09-30 DIAGNOSIS — M47816 Spondylosis without myelopathy or radiculopathy, lumbar region: Secondary | ICD-10-CM | POA: Diagnosis not present

## 2020-10-06 ENCOUNTER — Encounter (INDEPENDENT_AMBULATORY_CARE_PROVIDER_SITE_OTHER): Payer: Medicare HMO | Admitting: Internal Medicine

## 2020-10-09 ENCOUNTER — Inpatient Hospital Stay (HOSPITAL_COMMUNITY): Payer: Medicare HMO | Attending: Hematology

## 2020-10-09 ENCOUNTER — Other Ambulatory Visit: Payer: Self-pay

## 2020-10-09 DIAGNOSIS — E039 Hypothyroidism, unspecified: Secondary | ICD-10-CM | POA: Diagnosis not present

## 2020-10-09 DIAGNOSIS — N400 Enlarged prostate without lower urinary tract symptoms: Secondary | ICD-10-CM | POA: Insufficient documentation

## 2020-10-09 DIAGNOSIS — D751 Secondary polycythemia: Secondary | ICD-10-CM | POA: Diagnosis not present

## 2020-10-09 DIAGNOSIS — Z87891 Personal history of nicotine dependence: Secondary | ICD-10-CM | POA: Insufficient documentation

## 2020-10-09 DIAGNOSIS — I10 Essential (primary) hypertension: Secondary | ICD-10-CM | POA: Diagnosis not present

## 2020-10-09 DIAGNOSIS — Z148 Genetic carrier of other disease: Secondary | ICD-10-CM | POA: Diagnosis not present

## 2020-10-09 DIAGNOSIS — Z8249 Family history of ischemic heart disease and other diseases of the circulatory system: Secondary | ICD-10-CM | POA: Diagnosis not present

## 2020-10-09 DIAGNOSIS — K760 Fatty (change of) liver, not elsewhere classified: Secondary | ICD-10-CM | POA: Diagnosis not present

## 2020-10-09 DIAGNOSIS — D696 Thrombocytopenia, unspecified: Secondary | ICD-10-CM | POA: Insufficient documentation

## 2020-10-09 DIAGNOSIS — Z79899 Other long term (current) drug therapy: Secondary | ICD-10-CM | POA: Insufficient documentation

## 2020-10-09 DIAGNOSIS — E559 Vitamin D deficiency, unspecified: Secondary | ICD-10-CM | POA: Diagnosis not present

## 2020-10-09 DIAGNOSIS — G4730 Sleep apnea, unspecified: Secondary | ICD-10-CM | POA: Diagnosis not present

## 2020-10-09 DIAGNOSIS — E538 Deficiency of other specified B group vitamins: Secondary | ICD-10-CM | POA: Insufficient documentation

## 2020-10-09 DIAGNOSIS — Z803 Family history of malignant neoplasm of breast: Secondary | ICD-10-CM | POA: Diagnosis not present

## 2020-10-09 LAB — CBC WITH DIFFERENTIAL/PLATELET
Abs Immature Granulocytes: 0.01 10*3/uL (ref 0.00–0.07)
Basophils Absolute: 0 10*3/uL (ref 0.0–0.1)
Basophils Relative: 1 %
Eosinophils Absolute: 0.2 10*3/uL (ref 0.0–0.5)
Eosinophils Relative: 4 %
HCT: 50.5 % (ref 39.0–52.0)
Hemoglobin: 18 g/dL — ABNORMAL HIGH (ref 13.0–17.0)
Immature Granulocytes: 0 %
Lymphocytes Relative: 33 %
Lymphs Abs: 1.3 10*3/uL (ref 0.7–4.0)
MCH: 33 pg (ref 26.0–34.0)
MCHC: 35.6 g/dL (ref 30.0–36.0)
MCV: 92.5 fL (ref 80.0–100.0)
Monocytes Absolute: 0.4 10*3/uL (ref 0.1–1.0)
Monocytes Relative: 11 %
Neutro Abs: 2.1 10*3/uL (ref 1.7–7.7)
Neutrophils Relative %: 51 %
Platelets: 145 10*3/uL — ABNORMAL LOW (ref 150–400)
RBC: 5.46 MIL/uL (ref 4.22–5.81)
RDW: 11.6 % (ref 11.5–15.5)
WBC: 4.1 10*3/uL (ref 4.0–10.5)
nRBC: 0 % (ref 0.0–0.2)

## 2020-10-09 LAB — IRON AND TIBC
Iron: 157 ug/dL (ref 45–182)
Saturation Ratios: 86 % — ABNORMAL HIGH (ref 17.9–39.5)
TIBC: 183 ug/dL — ABNORMAL LOW (ref 250–450)
UIBC: 26 ug/dL

## 2020-10-09 LAB — VITAMIN B12: Vitamin B-12: 933 pg/mL — ABNORMAL HIGH (ref 180–914)

## 2020-10-09 LAB — FERRITIN: Ferritin: 272 ng/mL (ref 24–336)

## 2020-10-09 LAB — VITAMIN D 25 HYDROXY (VIT D DEFICIENCY, FRACTURES): Vit D, 25-Hydroxy: 103.03 ng/mL — ABNORMAL HIGH (ref 30–100)

## 2020-10-14 DIAGNOSIS — M47816 Spondylosis without myelopathy or radiculopathy, lumbar region: Secondary | ICD-10-CM | POA: Diagnosis not present

## 2020-10-14 DIAGNOSIS — M545 Low back pain, unspecified: Secondary | ICD-10-CM | POA: Diagnosis not present

## 2020-10-14 DIAGNOSIS — M9903 Segmental and somatic dysfunction of lumbar region: Secondary | ICD-10-CM | POA: Diagnosis not present

## 2020-10-15 NOTE — Progress Notes (Signed)
[place], [place] [address]   CLINIC:  Medical Oncology/Hematology  PCP:  Doree Albee, MD (Inactive) No address on file None   REASON FOR VISIT:  Follow-up for thrombocytopenia and secondary polycythemia  PRIOR THERAPY: None  CURRENT THERAPY: Intermittent phlebotomy  INTERVAL HISTORY:  Frank Noble 70 y.o. male returns for routine follow-up of thrombocytopenia and secondary polycythemia.  He was last seen by Tarri Abernethy PA-C on 04/14/2020.  At today's visit, he reports feeling fairly well.  No recent hospitalizations, surgeries, or changes in baseline health status.  Patient reports that he has had one phlebotomy (via blood donation) since his last visit, approximately April 2022.  Regarding his thrombocytopenia, he denies any bruising or petechial rash.  He denies any epistaxis, hematemesis, hematochezia, or melena.  He does report that his gums bleed easily, but he has been told by his dentist that he has inflammation of his gums.  Regarding his erythrocytosis, he denies any diagnosis of DVT/PE.  No current signs of blood clots such as unilateral leg swelling/pain/erythema, dyspnea, chest pain, or hemoptysis.  He denies any erythromelalgia, aquagenic pruritus, Raynaud's phenomenon.  No other vasomotor symptoms such as neuropathy, dizziness, vision changes, tinnitus, or strokelike symptoms.  He denies any B symptoms such as fever, chills, night sweats, unintentional weight loss.  No fatigue.  No unusual abdominal pain or joint pain.  Of note, the patient does report that he stopped receiving testosterone injections about 2 months ago.  He has 100% energy and 100% appetite. He endorses that he is maintaining a stable weight.   REVIEW OF SYSTEMS:  Review of Systems  Constitutional:  Negative for appetite change, chills, diaphoresis, fatigue, fever and unexpected weight change.  HENT:   Negative for lump/mass and nosebleeds.    Eyes:  Negative for eye problems.  Respiratory:  Negative for cough, hemoptysis and shortness of breath.   Cardiovascular:  Negative for chest pain, leg swelling and palpitations.  Gastrointestinal:  Negative for abdominal pain, blood in stool, constipation, diarrhea, nausea and vomiting.  Genitourinary:  Negative for hematuria.   Skin: Negative.   Neurological:  Positive for headaches (occasional). Negative for dizziness and light-headedness.  Hematological:  Does not bruise/bleed easily.     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   Sleep apnea    wears CPAP   Thyroid disease    hypothyroid   Vitamin D deficiency disease 11/28/2018   Past Surgical History:  Procedure Laterality Date   COLONOSCOPY N/A 04/05/2016   Procedure: COLONOSCOPY;  Surgeon: [name], MD;  Location: [facility];  Service: Endoscopy;  Laterality: N/A;  8:30 Am   COLONOSCOPY  07/10/2019   2018   TONSILLECTOMY       SOCIAL HISTORY:  Social History   Socioeconomic History   Marital status: Married    Spouse name: Not on file   Number of children: 3   Years of education: Not on file   Highest education level: Not on file  Occupational History   Occupation: retired  Tobacco Use   Smoking status: Former  Packs/day: 0.50    Years: 10.00    Pack years: 5.00    Types: Cigarettes   Smokeless tobacco: Never   Tobacco comments:    Quit age 56yrs  Vaping Use   Vaping Use: Never used  Substance and Sexual Activity   Alcohol use: Yes    Alcohol/week: 8.0 standard drinks    Types: 7 Cans of beer, 1 Shots of liquor per week    Comment: everyday, at least one  beer a day   Drug use: No   Sexual activity: Not on file  Other Topics Concern   Not on file  Social History Narrative         Married for 42 years.Lives with wife.Retired since Dec 2020..Originally from [place],in [place] years.    Social Determinants of Health   Financial Resource Strain: Low Risk    Difficulty of Paying Living Expenses: Not hard at all  Food Insecurity: No Food Insecurity   Worried About Charity fundraiser in the Last Year: Never true   Media in the Last Year: Never true  Transportation Needs: No Transportation Needs   Lack of Transportation (Medical): No   Lack of Transportation (Non-Medical): No  Physical Activity: Insufficiently Active   Days of Exercise per Week: 3 days   Minutes of Exercise per Session: 30 min  Stress: No Stress Concern Present   Feeling of Stress : Only a little  Social Connections: Moderately Isolated   Frequency of Communication with Friends and Family: More than three times a week   Frequency of Social Gatherings with Friends and Family: Never   Attends Religious Services: Never   Printmaker: No   Attends Music therapist: Never   Marital Status: Married  Human resources officer Violence: Not At Risk   Fear of Current or Ex-Partner: No   Emotionally Abused: No   Physically Abused: No   Sexually Abused: No    FAMILY HISTORY:  Family History  Problem Relation Age of Onset   Breast cancer Mother        Mets to liver   High blood pressure Father    Aneurysm Father    Esophageal cancer Maternal Grandmother    Colon cancer Neg Hx    Rectal cancer Neg Hx    Stomach cancer Neg Hx    Colon polyps Neg Hx     CURRENT MEDICATIONS:  Outpatient Encounter Medications as of 10/16/2020  Medication Sig   amLODipine (NORVASC) 5 MG tablet TAKE 1 TABLET (5 MG TOTAL) BY MOUTH DAILY.   atorvastatin (LIPITOR) 20 MG tablet TAKE 1 TABLET EVERY DAY   Cetirizine HCl (ZYRTEC PO) Take by mouth as  needed.   Cholecalciferol (VITAMIN D-3) 5000 units TABS Take 10,000 Units by mouth daily.    NP THYROID 90 MG tablet Take 1 tablet (90 mg total) by mouth daily.   omeprazole (PRILOSEC) 20 MG capsule 1 capsule 30 minutes before breakfast as needed for reflux/indigestion   tadalafil (CIALIS) 5 MG tablet TAKE ONE TABLET BY MOUTH DAILY   tamsulosin (FLOMAX) 0.4 MG CAPS capsule TAKE 1 CAPSULE DAILY   testosterone cypionate (DEPOTESTOSTERONE CYPIONATE) 200 MG/ML injection INJECT 0.5 MLS (100 MG TOTAL) INTO THE MUSCLE EVERY 7 (SEVEN) DAYS. INJECTS 0.5ML ON SUNDAYS.   No facility-administered encounter medications on file as of 10/16/2020.    ALLERGIES:  Allergies  Allergen Reactions   Pollen Extract-Tree Extract [Pollen Extract]  PHYSICAL EXAM:  ECOG PERFORMANCE STATUS: 0 - Asymptomatic  There were no vitals filed for this visit. There were no vitals filed for this visit. Physical Exam Constitutional:      Appearance: Normal appearance. He is obese.  HENT:     Head: Normocephalic and atraumatic.     Mouth/Throat:     Mouth: Mucous membranes are moist.  Eyes:     Extraocular Movements: Extraocular movements intact.     Pupils: Pupils are equal, round, and reactive to light.  Cardiovascular:     Rate and Rhythm: Normal rate and regular rhythm.     Pulses: Normal pulses.     Heart sounds: Normal heart sounds.  Pulmonary:     Effort: Pulmonary effort is normal.     Breath sounds: Normal breath sounds.  Abdominal:     General: Bowel sounds are normal.     Palpations: Abdomen is soft.     Tenderness: There is no abdominal tenderness.  Musculoskeletal:        General: No swelling.     Right lower leg: No edema.     Left lower leg: No edema.  Lymphadenopathy:     Cervical: No cervical adenopathy.  Skin:    General: Skin is warm and dry.  Neurological:     General: No focal deficit present.     Mental Status: He is alert and oriented to person, place, and time.  Psychiatric:         Mood and Affect: Mood normal.        Behavior: Behavior normal.     LABORATORY DATA:  I have reviewed the labs as listed.  CBC    Component Value Date/Time   WBC 4.1 10/09/2020 1103   RBC 5.46 10/09/2020 1103   HGB 18.0 (H) 10/09/2020 1103   HCT 50.5 10/09/2020 1103   PLT 145 (L) 10/09/2020 1103   MCV 92.5 10/09/2020 1103   MCH 33.0 10/09/2020 1103   MCHC 35.6 10/09/2020 1103   RDW 11.6 10/09/2020 1103   LYMPHSABS 1.3 10/09/2020 1103   MONOABS 0.4 10/09/2020 1103   EOSABS 0.2 10/09/2020 1103   BASOSABS 0.0 10/09/2020 1103   CMP Latest Ref Rng & Units 02/26/2020 08/28/2019 04/17/2019  Glucose 65 - 139 mg/dL 88 88 100(H)  BUN 7 - 25 mg/dL 13 16 14   Creatinine 0.70 - 1.25 mg/dL 1.21 1.33(H) 1.29(H)  Sodium 135 - 146 mmol/L 143 141 140  Potassium 3.5 - 5.3 mmol/L 4.3 4.3 4.2  Chloride 98 - 110 mmol/L 108 105 103  CO2 20 - 32 mmol/L 28 29 28   Calcium 8.6 - 10.3 mg/dL 9.1 8.8 9.5  Total Protein 6.1 - 8.1 g/dL 5.9(L) 6.0(L) 6.4  Total Bilirubin 0.2 - 1.2 mg/dL 0.8 1.0 1.1  AST 10 - 35 U/L 25 28 26   ALT 9 - 46 U/L 33 35 33    DIAGNOSTIC IMAGING:  I have independently reviewed the relevant imaging and discussed with the patient.  ASSESSMENT & PLAN: 1.  Thrombocytopenia, mild - Mild thrombocytopenia since at least March 2021, platelets ranging from 120-150 - Denies any easy bruising or bleeding.   - Ultrasound of the abdomen on 04/25/2019 shows normal spleen but the liver consistent with fatty infiltration.  - Most recent labs (10/09/2020): Platelets mildly low but stable at 145, B12 mildly elevated at 933 - Suspect mild thrombocytopenia secondary to fatty liver infiltration.  Differential diagnosis also includes immune mediated thrombocytopenia versus early MDS. - PLAN: No further work-up  or treatment needed at this time.  We will continue to monitor with periodic CBCs.  2.  Secondary polycythemia: - Erythrocytosis / secondary polycythemia in the setting of sleep apnea  (compliant with CPAP) and testosterone supplement - He has sleep apnea and uses CPAP for the last 10 years.  He is a non-smoker. - He is on testosterone supplements for the last 2 to 3 years - stopped in July 2022 - JAK2 V617F testing negative.   - Erythropoietin mildly elevated at 20.8, likely secondary to testosterone supplement (normal-appearing kidneys on abdominal ultrasound 04/25/19 with no evidence of cysts or benign tumors liver kidneys) - He received phlebotomy x1 in April 2022 (blood donation at outside facility) - He is asymptomatic - no vasomotor symptoms or blood clots - Most recent labs (10/09/2020): Hgb 18.0 with hematocrit 50.5 - Discussed with patient that indications for phlebotomy in cases of secondary polycythemia would be severe symptoms such as strokelike symptoms, severe recurrent headaches, severe fatigue, or if hematocrit is greater than 54. - PLAN: Since patient recently stopped phlebotomy (July 2022), but is still noted to have elevated hemoglobin, we will recheck CBC in 2 months.  We will also check CALR & MPL testing at that time.  Phone visit in 2 months to discuss results.  3.  Heterozygous for H63D - Noted to be heterozygote for H63D hemochromatosis gene on 03/20/2020, which is a carrier state - Heterozygous state expected to show mildly increased iron, but not expected to show signs of organ damage secondary to iron overload - Iron panel (10/09/2020): Ferritin 272, iron saturation 86% - Patient advised that he is at slightly increased risk of iron overload due to his fatty liver disease and heterozygosity for H63D hemochromatosis mutation.  Advised to avoid oral iron supplementation, alcohol consumption, cooking in cast iron skillets, or iron-rich diet.  Patient could also consider home water filtration system due to concerns regarding high iron content and local water. - PLAN: No indication for treatment at this time.  If patient develops ferritin > 800 - 1,000 we would  consider MRI abdomen to rule out iron deposition in the liver.  Repeat iron panel in 6 months.  4.  Hepatic steatosis/fatty liver disease - Noted on abdominal ultrasound 04/25/2019 to have fatty liver infiltration, no splenomegaly - Follows with GI specialists -Counseled patient to cut back on alcohol intake (he drinks 1-2 beers per day), as he is at increased risk for further liver disease given his moderate alcohol intake and his status of heterozygosity for H63D (hemochromatosis carrier)  - PLAN: Patient encouraged to stop all alcohol consumption.  5.  Vitamin B12 deficiency -Takes daily vitamin B12 supplements 1,000 mcg daily - Vitamin B-12 (10/09/2020): Slightly elevated at 933 - PLAN: Advised to change B12 supplement to every other day dosing.  We will repeat B12/methylmalonic acid at follow-up visit in 6 months.  6.  Vitamin D deficiency - Takes daily vitamin D supplement 10,000 units daily - Most recent Vitamin D (10/09/2020) elevated at 103.03. - PLAN: Patient advised to decrease dose of vitamin D to 5,000 units daily (or 10,000 every other day).  We will recheck vitamin D at follow-up visit in 6 months.   PLAN SUMMARY & DISPOSITION: - Labs in 2 months (CBC, CALR, MPL, and JAK2 E 12-15) - Phone visit 2 weeks after labs  All questions were answered. The patient knows to call the clinic with any problems, questions or concerns.  Medical decision making: Moderate  Time spent on visit:  I spent 20 minutes counseling the patient face to face. The total time spent in the appointment was 30 minutes and more than 50% was on counseling.   Harriett Rush, PA-C  10/16/2020 12:27 PM

## 2020-10-16 ENCOUNTER — Other Ambulatory Visit: Payer: Self-pay

## 2020-10-16 ENCOUNTER — Inpatient Hospital Stay (HOSPITAL_COMMUNITY): Payer: Medicare HMO | Admitting: Physician Assistant

## 2020-10-16 VITALS — BP 122/78 | HR 72 | Temp 97.3°F | Resp 18 | Wt 204.3 lb

## 2020-10-16 DIAGNOSIS — Z8669 Personal history of other diseases of the nervous system and sense organs: Secondary | ICD-10-CM | POA: Diagnosis not present

## 2020-10-16 DIAGNOSIS — D751 Secondary polycythemia: Secondary | ICD-10-CM | POA: Diagnosis not present

## 2020-10-16 DIAGNOSIS — G4730 Sleep apnea, unspecified: Secondary | ICD-10-CM | POA: Diagnosis not present

## 2020-10-16 DIAGNOSIS — K760 Fatty (change of) liver, not elsewhere classified: Secondary | ICD-10-CM | POA: Diagnosis not present

## 2020-10-16 DIAGNOSIS — D696 Thrombocytopenia, unspecified: Secondary | ICD-10-CM | POA: Diagnosis not present

## 2020-10-16 DIAGNOSIS — N529 Male erectile dysfunction, unspecified: Secondary | ICD-10-CM | POA: Diagnosis not present

## 2020-10-16 DIAGNOSIS — Z87891 Personal history of nicotine dependence: Secondary | ICD-10-CM | POA: Diagnosis not present

## 2020-10-16 DIAGNOSIS — K601 Chronic anal fissure: Secondary | ICD-10-CM | POA: Diagnosis not present

## 2020-10-16 DIAGNOSIS — N400 Enlarged prostate without lower urinary tract symptoms: Secondary | ICD-10-CM | POA: Diagnosis not present

## 2020-10-16 DIAGNOSIS — E538 Deficiency of other specified B group vitamins: Secondary | ICD-10-CM | POA: Diagnosis not present

## 2020-10-16 DIAGNOSIS — I10 Essential (primary) hypertension: Secondary | ICD-10-CM | POA: Diagnosis not present

## 2020-10-16 DIAGNOSIS — I872 Venous insufficiency (chronic) (peripheral): Secondary | ICD-10-CM | POA: Diagnosis not present

## 2020-10-16 DIAGNOSIS — E559 Vitamin D deficiency, unspecified: Secondary | ICD-10-CM | POA: Diagnosis not present

## 2020-10-16 DIAGNOSIS — Z7689 Persons encountering health services in other specified circumstances: Secondary | ICD-10-CM | POA: Diagnosis not present

## 2020-10-16 DIAGNOSIS — E039 Hypothyroidism, unspecified: Secondary | ICD-10-CM | POA: Diagnosis not present

## 2020-10-16 DIAGNOSIS — Z1589 Genetic susceptibility to other disease: Secondary | ICD-10-CM | POA: Diagnosis not present

## 2020-10-16 NOTE — Patient Instructions (Signed)
[place] at [facility] Discharge Instructions  You were seen today by Tarri Abernethy PA-C for your elevated hemoglobin counts, low platelets, and other conditions noted below.  THROMBOCYTOPENIA: Your low platelets are most likely due to fatty liver disease, but may also be due to immune system dysfunction causing your body to attack platelets.  Regardless, they are not at a dangerous level, and have remained stable but mildly low.  We will continue to monitor them with periodic CBC.  POLYCYTHEMIA: Your elevated hemoglobin/hematocrit (blood counts) are suspected to be related to testosterone injections and underlying sleep apnea.  However, your counts have remained elevated after stopping testosterone, so we will check some additional GENETIC TESTS to see if you have any underlying genetic mutations causing your elevated blood counts.  Do not get any phlebotomy/blood donation until we have rechecked your blood counts in 2 months.  H63D GENE MUTATION: You have a single broken copy of the H63D gene, which causes a slightly increased risk of hemochromatosis (iron overload).  You also have fatty liver disease, which also predisposes you to iron overload.  There is no need for treatment at this time, but we will continue to watch your iron levels and will run further testing if they become severely elevated (ferritin greater than 800).  In the time being, do not take any oral iron supplementation.  Avoid cooking in cast iron pans.  Avoid alcohol consumption.  Consider a water filter due to high iron content in local water.  LABS: Return in 2 months for repeat labs  OTHER TESTS: None at this time  MEDICATIONS: Change your B12 and vitamin D to EVERY OTHER DAY.  FOLLOW-UP APPOINTMENT: Phone visit in 2 months, to discuss lab results.   Thank you for choosing [place] at [facility] to provide your oncology and hematology care.  To afford each patient  quality time with our provider, please arrive at least 15 minutes before your scheduled appointment time.   If you have a lab appointment with the [facility] please come in thru the Main Entrance and check in at the main information desk.  You need to re-schedule your appointment should you arrive 10 or more minutes late.  We strive to give you quality time with our providers, and arriving late affects you and other patients whose appointments are after yours.  Also, if you no show three or more times for appointments you may be dismissed from the clinic at the providers discretion.     Again, thank you for choosing [facility].  Our hope is that these requests will decrease the amount of time that you wait before being seen by our physicians.       _____________________________________________________________  Should you have questions after your visit to [facility], please contact our office at [phone] and follow the prompts.  Our office hours are 8:00 a.m. and 4:30 p.m. Monday - Friday.  Please note that voicemails left after 4:00 p.m. may not be returned until the following business day.  We are closed weekends and major holidays.  You do have access to a nurse 24-7, just call the main number to the clinic [phone] and do not press any options, hold on the line and a nurse will answer the phone.    For prescription refill requests, have your pharmacy contact our office and allow 72 hours.    Due to Covid, you will need to wear  a mask upon entering the hospital. If you do not have a mask, a mask will be given to you at the Main Entrance upon arrival. For doctor visits, patients may have 1 support person age 39 or older with them. For treatment visits, patients can not have anyone with them due to social distancing guidelines and our immunocompromised population.

## 2020-10-28 DIAGNOSIS — M9903 Segmental and somatic dysfunction of lumbar region: Secondary | ICD-10-CM | POA: Diagnosis not present

## 2020-10-28 DIAGNOSIS — M545 Low back pain, unspecified: Secondary | ICD-10-CM | POA: Diagnosis not present

## 2020-10-28 DIAGNOSIS — M47816 Spondylosis without myelopathy or radiculopathy, lumbar region: Secondary | ICD-10-CM | POA: Diagnosis not present

## 2020-11-11 DIAGNOSIS — M47816 Spondylosis without myelopathy or radiculopathy, lumbar region: Secondary | ICD-10-CM | POA: Diagnosis not present

## 2020-11-11 DIAGNOSIS — M9903 Segmental and somatic dysfunction of lumbar region: Secondary | ICD-10-CM | POA: Diagnosis not present

## 2020-11-11 DIAGNOSIS — M545 Low back pain, unspecified: Secondary | ICD-10-CM | POA: Diagnosis not present

## 2020-11-26 DIAGNOSIS — M47816 Spondylosis without myelopathy or radiculopathy, lumbar region: Secondary | ICD-10-CM | POA: Diagnosis not present

## 2020-11-26 DIAGNOSIS — M545 Low back pain, unspecified: Secondary | ICD-10-CM | POA: Diagnosis not present

## 2020-11-26 DIAGNOSIS — M9903 Segmental and somatic dysfunction of lumbar region: Secondary | ICD-10-CM | POA: Diagnosis not present

## 2020-12-17 DIAGNOSIS — R300 Dysuria: Secondary | ICD-10-CM | POA: Diagnosis not present

## 2020-12-17 DIAGNOSIS — M9903 Segmental and somatic dysfunction of lumbar region: Secondary | ICD-10-CM | POA: Diagnosis not present

## 2020-12-17 DIAGNOSIS — M47816 Spondylosis without myelopathy or radiculopathy, lumbar region: Secondary | ICD-10-CM | POA: Diagnosis not present

## 2020-12-17 DIAGNOSIS — M545 Low back pain, unspecified: Secondary | ICD-10-CM | POA: Diagnosis not present

## 2020-12-17 DIAGNOSIS — R319 Hematuria, unspecified: Secondary | ICD-10-CM | POA: Diagnosis not present

## 2020-12-18 ENCOUNTER — Inpatient Hospital Stay (HOSPITAL_COMMUNITY): Payer: Medicare HMO | Attending: Hematology

## 2020-12-18 ENCOUNTER — Other Ambulatory Visit: Payer: Self-pay

## 2020-12-18 DIAGNOSIS — G4730 Sleep apnea, unspecified: Secondary | ICD-10-CM | POA: Insufficient documentation

## 2020-12-18 DIAGNOSIS — Z79899 Other long term (current) drug therapy: Secondary | ICD-10-CM | POA: Diagnosis not present

## 2020-12-18 DIAGNOSIS — E538 Deficiency of other specified B group vitamins: Secondary | ICD-10-CM | POA: Diagnosis not present

## 2020-12-18 DIAGNOSIS — D751 Secondary polycythemia: Secondary | ICD-10-CM | POA: Diagnosis not present

## 2020-12-18 DIAGNOSIS — E039 Hypothyroidism, unspecified: Secondary | ICD-10-CM | POA: Insufficient documentation

## 2020-12-18 DIAGNOSIS — Z803 Family history of malignant neoplasm of breast: Secondary | ICD-10-CM | POA: Insufficient documentation

## 2020-12-18 DIAGNOSIS — D696 Thrombocytopenia, unspecified: Secondary | ICD-10-CM | POA: Insufficient documentation

## 2020-12-18 DIAGNOSIS — Z87891 Personal history of nicotine dependence: Secondary | ICD-10-CM | POA: Insufficient documentation

## 2020-12-18 DIAGNOSIS — Z148 Genetic carrier of other disease: Secondary | ICD-10-CM | POA: Insufficient documentation

## 2020-12-18 DIAGNOSIS — E559 Vitamin D deficiency, unspecified: Secondary | ICD-10-CM | POA: Diagnosis not present

## 2020-12-18 DIAGNOSIS — I10 Essential (primary) hypertension: Secondary | ICD-10-CM | POA: Diagnosis not present

## 2020-12-18 DIAGNOSIS — Z8249 Family history of ischemic heart disease and other diseases of the circulatory system: Secondary | ICD-10-CM | POA: Insufficient documentation

## 2020-12-18 DIAGNOSIS — K760 Fatty (change of) liver, not elsewhere classified: Secondary | ICD-10-CM | POA: Diagnosis not present

## 2020-12-18 LAB — CBC WITH DIFFERENTIAL/PLATELET
Abs Immature Granulocytes: 0.01 10*3/uL (ref 0.00–0.07)
Basophils Absolute: 0 10*3/uL (ref 0.0–0.1)
Basophils Relative: 1 %
Eosinophils Absolute: 0.1 10*3/uL (ref 0.0–0.5)
Eosinophils Relative: 3 %
HCT: 41.6 % (ref 39.0–52.0)
Hemoglobin: 15 g/dL (ref 13.0–17.0)
Immature Granulocytes: 0 %
Lymphocytes Relative: 30 %
Lymphs Abs: 1.3 10*3/uL (ref 0.7–4.0)
MCH: 34.6 pg — ABNORMAL HIGH (ref 26.0–34.0)
MCHC: 36.1 g/dL — ABNORMAL HIGH (ref 30.0–36.0)
MCV: 95.9 fL (ref 80.0–100.0)
Monocytes Absolute: 0.4 10*3/uL (ref 0.1–1.0)
Monocytes Relative: 9 %
Neutro Abs: 2.4 10*3/uL (ref 1.7–7.7)
Neutrophils Relative %: 57 %
Platelets: 159 10*3/uL (ref 150–400)
RBC: 4.34 MIL/uL (ref 4.22–5.81)
RDW: 12.9 % (ref 11.5–15.5)
WBC: 4.2 10*3/uL (ref 4.0–10.5)
nRBC: 0 % (ref 0.0–0.2)

## 2020-12-29 LAB — CALR + JAK2 E12-15 + MPL (REFLEXED)

## 2020-12-29 LAB — JAK2 V617F, W REFLEX TO CALR/E12/MPL

## 2020-12-31 DIAGNOSIS — M9903 Segmental and somatic dysfunction of lumbar region: Secondary | ICD-10-CM | POA: Diagnosis not present

## 2020-12-31 DIAGNOSIS — M545 Low back pain, unspecified: Secondary | ICD-10-CM | POA: Diagnosis not present

## 2020-12-31 DIAGNOSIS — M47816 Spondylosis without myelopathy or radiculopathy, lumbar region: Secondary | ICD-10-CM | POA: Diagnosis not present

## 2020-12-31 NOTE — Progress Notes (Signed)
Virtual Visit via Telephone Note Frank Noble  I connected with Frank Noble  on 01/01/21  at  9:40 AM  by telephone and verified that I am speaking with the correct person using two identifiers.  Location: Patient: Home Provider: [facility]   I discussed the limitations, risks, security and privacy concerns of performing an evaluation and management service by telephone and the availability of in person appointments. I also discussed with the patient that there may be a patient responsible charge related to this service. The patient expressed understanding and agreed to proceed.   REASON FOR VISIT:  Follow-up for thrombocytopenia and secondary polycythemia   PRIOR THERAPY: None   CURRENT THERAPY: Intermittent phlebotomy  HISTORY OF PRESENT ILLNESS: [name] returns for follow-up of thrombocytopenia and secondary polycythemia.  He was last seen by Tarri Abernethy PA-C on 10/16/2020.  At today's visit, he reports feeling well.  No recent hospitalizations, surgeries, or changes in baseline health status.  Regarding his erythrocytosis, he had stopped taking testosterone injections in July 2022 to see how his blood counts responded.  Since his blood counts returned to normal, he reports that he started back on testosterone last week.  He has not had any phlebotomies since his last visit.  He denies any significant fatigue.  No signs or symptoms of blood clots.  No aquagenic pruritus, Raynaud's phenomenon, erythromelalgia, or vasomotor symptoms.  Regarding his mild thrombocytopenia, he has not noticed any abnormal bleeding, bruising, or petechial rash.  No B symptoms such as fever, chills, night sweats, unintentional weight loss.  He has 75% energy and 100% appetite.  He endorses that he is maintaining a stable weight at this time.    OBSERVATIONS/OBJECTIVE: Review of Systems  Constitutional:  Negative for chills, diaphoresis, fever,  malaise/fatigue and weight loss.  Respiratory:  Negative for cough and shortness of breath.   Cardiovascular:  Negative for chest pain and palpitations.  Gastrointestinal:  Negative for abdominal pain, blood in stool, melena, nausea and vomiting.  Genitourinary:        Seeing urologist for unspecified "urologic issues"  Neurological:  Negative for dizziness and headaches.    PHYSICAL EXAM (per limitations of virtual telephone visit): The patient is alert and oriented x 3, exhibiting adequate mentation, good mood, and ability to speak in full sentences and execute sound judgement.   ASSESSMENT & PLAN: 1.  Thrombocytopenia, mild - Mild thrombocytopenia since at least March 2021, platelets ranging from 120-150 - Denies any easy bruising or bleeding.   - Ultrasound of the abdomen on 04/25/2019 shows normal spleen but the liver consistent with fatty infiltration.  - Most recent labs (12/18/2020): Platelets normal at 159 - Suspect mild thrombocytopenia secondary to fatty liver infiltration.  Differential diagnosis also includes chronic immune mediated thrombocytopenia versus early MDS. - PLAN: No further work-up or treatment needed at this time.  We will continue to monitor with periodic CBCs.   2.  Secondary polycythemia: - Erythrocytosis / secondary polycythemia in the setting of sleep apnea (compliant with CPAP) and testosterone supplement - He has sleep apnea and uses CPAP for the last 10 years.  He is a non-smoker. - He is on testosterone supplements for the last 2 to 3 years -he stopped from July 2022 through December 2022, with subsequent normalization of his blood counts.  He restarted testosterone on 12/25/2020. - JAK2 V617F, CALR, and MPL testing negative.   - Erythropoietin mildly elevated at 20.8 (September 2021), likely secondary to  testosterone supplement (normal-appearing kidneys on abdominal ultrasound 04/25/19 with no evidence of cysts or benign tumors liver kidneys) - He received  phlebotomy x1 in April 2022 (blood donation at outside facility)  - He is asymptomatic - no vasomotor symptoms or blood clots   - Most recent labs (12/18/2020): Hgb 15.0/HCT 41.6 - Discussed with patient that indications for phlebotomy in cases of secondary polycythemia would be severe symptoms such as strokelike symptoms, severe recurrent headaches, severe fatigue, or if hematocrit is greater than 54. - PLAN: Repeat labs and RTC in 4 months. - Patient advised that while he is on testosterone supplements he can choose to donate blood every 3 to 4 months.  Otherwise, we can prescribe therapeutic phlebotomy if he develops symptoms or has HCT greater than 54.  3.  Heterozygous for H63D - Noted to be heterozygote for H63D hemochromatosis gene on 03/20/2020, which is a carrier state - Heterozygous state expected to show mildly increased iron, but not expected to show signs of organ damage secondary to iron overload - Iron panel (10/09/2020): Ferritin 272, iron saturation 86% - Patient advised that he is at slightly increased risk of iron overload due to his fatty liver disease and heterozygosity for H63D hemochromatosis mutation.  Advised to avoid oral iron supplementation, alcohol consumption, cooking in cast iron skillets, or iron-rich diet.  Patient could also consider home water filtration system due to concerns regarding high iron content and local water. - PLAN: No indication for treatment at this time.  If patient develops ferritin > 800 - 1,000 we would consider MRI abdomen to rule out iron deposition in the liver.  Repeat iron panel in 6 months.   4.  Hepatic steatosis/fatty liver disease - Noted on abdominal ultrasound 04/25/2019 to have fatty liver infiltration, no splenomegaly - Follows with GI specialists -Counseled patient to cut back on alcohol intake (he drinks 1-2 beers per day), as he is at increased risk for further liver disease given his moderate alcohol intake and his status of  heterozygosity for H63D (hemochromatosis carrier)  - PLAN: Patient encouraged to stop all alcohol consumption.   5.  Vitamin B12 deficiency -Takes daily vitamin B12 supplements 1,000 mcg daily - Vitamin B-12 (10/09/2020): Slightly elevated at 933 - PLAN: At last visit, he was advised to change B12 supplement to every other day dosing.  We will repeat B12/methylmalonic acid at follow-up visit in 4 months.   6.  Vitamin D deficiency - Takes daily vitamin D supplement 10,000 units daily - Most recent Vitamin D (10/09/2020) elevated at 103.03. - PLAN: At last visit, patient advised to decrease dose of vitamin D to 5,000 units daily (or 10,000 every other day).  We will recheck vitamin D at follow-up visit in 4 months.    FOLLOW UP INSTRUCTIONS: Labs and office visit in 4 months    I discussed the assessment and treatment plan with the patient. The patient was provided an opportunity to ask questions and all were answered. The patient agreed with the plan and demonstrated an understanding of the instructions.   The patient was advised to call back or seek an in-person evaluation if the symptoms worsen or if the condition fails to improve as anticipated.  I provided 18 minutes of non-face-to-face time during this encounter.   Harriett Rush, PA-C 01/01/2021 9:52 AM

## 2021-01-01 ENCOUNTER — Other Ambulatory Visit: Payer: Self-pay

## 2021-01-01 ENCOUNTER — Inpatient Hospital Stay (HOSPITAL_COMMUNITY): Payer: Medicare HMO | Admitting: Physician Assistant

## 2021-01-01 DIAGNOSIS — D696 Thrombocytopenia, unspecified: Secondary | ICD-10-CM | POA: Diagnosis not present

## 2021-01-01 DIAGNOSIS — E559 Vitamin D deficiency, unspecified: Secondary | ICD-10-CM | POA: Diagnosis not present

## 2021-01-01 DIAGNOSIS — K760 Fatty (change of) liver, not elsewhere classified: Secondary | ICD-10-CM | POA: Diagnosis not present

## 2021-01-01 DIAGNOSIS — D751 Secondary polycythemia: Secondary | ICD-10-CM | POA: Diagnosis not present

## 2021-01-01 DIAGNOSIS — E538 Deficiency of other specified B group vitamins: Secondary | ICD-10-CM | POA: Diagnosis not present

## 2021-01-28 DIAGNOSIS — M545 Low back pain, unspecified: Secondary | ICD-10-CM | POA: Diagnosis not present

## 2021-01-28 DIAGNOSIS — M9903 Segmental and somatic dysfunction of lumbar region: Secondary | ICD-10-CM | POA: Diagnosis not present

## 2021-01-28 DIAGNOSIS — M47816 Spondylosis without myelopathy or radiculopathy, lumbar region: Secondary | ICD-10-CM | POA: Diagnosis not present

## 2021-02-03 ENCOUNTER — Other Ambulatory Visit: Payer: Self-pay

## 2021-02-03 ENCOUNTER — Ambulatory Visit: Payer: Medicare HMO | Admitting: Urology

## 2021-02-03 ENCOUNTER — Encounter: Payer: Self-pay | Admitting: Urology

## 2021-02-03 VITALS — BP 158/87 | HR 77 | Ht 69.0 in | Wt 212.0 lb

## 2021-02-03 DIAGNOSIS — R3914 Feeling of incomplete bladder emptying: Secondary | ICD-10-CM

## 2021-02-03 DIAGNOSIS — N401 Enlarged prostate with lower urinary tract symptoms: Secondary | ICD-10-CM | POA: Diagnosis not present

## 2021-02-03 DIAGNOSIS — R310 Gross hematuria: Secondary | ICD-10-CM

## 2021-02-03 LAB — URINALYSIS, ROUTINE W REFLEX MICROSCOPIC
Bilirubin, UA: NEGATIVE
Glucose, UA: NEGATIVE
Ketones, UA: NEGATIVE
Leukocytes,UA: NEGATIVE
Nitrite, UA: NEGATIVE
Protein,UA: NEGATIVE
RBC, UA: NEGATIVE
Specific Gravity, UA: 1.015 (ref 1.005–1.030)
Urobilinogen, Ur: 0.2 mg/dL (ref 0.2–1.0)
pH, UA: 7 (ref 5.0–7.5)

## 2021-02-03 LAB — BLADDER SCAN AMB NON-IMAGING: Scan Result: 28

## 2021-02-03 NOTE — Progress Notes (Signed)
Assessment: 1. Gross hematuria   2. Benign prostatic hyperplasia with incomplete bladder emptying     Plan: Outside records from his PCP were reviewed in the office today. Today I had a discussion with the patient regarding the findings of gross hematuria including the implications and differential diagnoses associated with it.  I also discussed recommendations for further evaluation including the rationale for upper tract imaging and cystoscopy.  I discussed the nature of these procedures including potential risk and complications.  The patient expressed an understanding of these issues. Schedule for CT hematuria protocol and cystoscopy BMP today.  Chief Complaint:  Chief Complaint  Patient presents with   Hematuria    History of Present Illness:  Frank Noble is a 71 y.o. year old male who is seen in consultation from Josephine Igo, MD for evaluation of gross hematuria.  He reports a 2-day history of gross hematuria in November 2022.  This began after driving a long distance.  He noted some back pain as well.  He did have some slight increase in his dysuria at the initiation of his urine stream.  The hematuria resolved spontaneously.  No further episodes.  No prior history of gross hematuria.  No known history of kidney stones.  No history of UTIs. He was seen by his PCP in November 2022.   U/A negative, culture-no growth, PSA 2.3 The pt reports frequency, urgency, and nocturia symptoms are stable.  IPSS = 16 today.  Pt satisfied with tamsulosin and Cialis which are long term Rxs for him.  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   Sleep apnea     wears CPAP   Thyroid disease    hypothyroid   Vitamin D deficiency disease 11/28/2018    Past Surgical History:  Past Surgical History:  Procedure Laterality Date   COLONOSCOPY N/A 04/05/2016   Procedure: COLONOSCOPY;  Surgeon: [name], MD;  [facility];  Service: Endoscopy;  Laterality: N/A;  8:30 Am   COLONOSCOPY  07/10/2019   2018   TONSILLECTOMY      Allergies:  Allergies  Allergen Reactions   Pollen Extract-Tree Extract [Pollen Extract]     Family History:  Family History  Problem Relation Age of Onset   Breast cancer Mother        Mets to liver   High blood pressure Father    Aneurysm Father    Esophageal cancer Maternal Grandmother    Colon cancer Neg Hx    Rectal cancer Neg Hx    Stomach cancer Neg Hx    Colon polyps Neg Hx     Social History:  Social History   Tobacco Use   Smoking status: Former    Packs/day: 0.50    Years: 10.00    Pack years: 5.00    Types: Cigarettes   Smokeless tobacco: Never   Tobacco comments:    Quit age 59yrs  Vaping Use   Vaping Use: Never used  Substance Use Topics   Alcohol use: Yes    Alcohol/week: 8.0 standard drinks    Types: 7 Cans of beer, 1 Shots  of liquor per week    Comment: everyday, at least one beer a day   Drug use: No    Review of symptoms:  Constitutional:  Negative for unexplained weight loss, night sweats, fever, chills ENT:  Negative for nose bleeds, sinus pain, painful swallowing CV:  Negative for chest pain, shortness of breath, exercise intolerance, palpitations, loss of consciousness Resp:  Negative for cough, wheezing, shortness of breath GI:  Negative for nausea, vomiting, diarrhea, bloody stools GU:  Positives noted in HPI Neuro:  Negative for seizures, poor balance, limb weakness, slurred speech Psych:  Negative for lack of energy, depression, anxiety Endocrine:  Negative for polydipsia, polyuria, symptoms of hypoglycemia (dizziness, hunger, sweating) Hematologic:   Negative for anemia, purpura, petechia, prolonged or excessive bleeding, use of anticoagulants  Allergic:  Negative for difficulty breathing or choking as a result of exposure to anything; no shellfish allergy; no allergic response (rash/itch) to materials, foods  Physical exam: BP (!) 158/87    Pulse 77    Ht 5\' 9"  (1.753 m)    Wt 212 lb (96.2 kg)    BMI 31.31 kg/m  GENERAL APPEARANCE:  Well appearing, well developed, well nourished, NAD HEENT: Atraumatic, Normocephalic, oropharynx clear. NECK: Supple without lymphadenopathy or thyromegaly. LUNGS: Clear to auscultation bilaterally. HEART: Regular Rate and Rhythm without murmurs, gallops, or rubs. ABDOMEN: Soft, non-tender, No Masses. EXTREMITIES: Moves all extremities well.  Without clubbing, cyanosis, or edema. NEUROLOGIC:  Alert and oriented x 3, normal gait, CN II-XII grossly intact.  MENTAL STATUS:  Appropriate. BACK:  Non-tender to palpation.  No CVAT SKIN:  Warm, dry and intact.   GU: Penis:  uncircumcised Meatus: Normal Scrotum: small bilateral hydroceles Testis: normal without masses bilateral Epididymis: normal Prostate: 40 g, NT, no nodules Rectum: Normal tone,  no masses or tenderness   Results: U/A dipstick: negative   Results for orders placed or performed in visit on 02/03/21 (from the past 24 hour(s))  BLADDER SCAN AMB NON-IMAGING   Collection Time: 02/03/21  1:42 PM  Result Value Ref Range   Scan Result 28 ml

## 2021-02-03 NOTE — Progress Notes (Signed)
Urological Symptom Review  Patient is experiencing the following symptoms: Frequent urination Hard to postpone urination Get up at night to urinate   Review of Systems  Gastrointestinal (upper)  : Indigestion/heartburn  Gastrointestinal (lower) : Negative for lower GI symptoms  Constitutional : Negative for symptoms  Skin: Negative for skin symptoms  Eyes: Negative for eye symptoms  Ear/Nose/Throat : Negative for Ear/Nose/Throat symptoms  Hematologic/Lymphatic: Negative for Hematologic/Lymphatic symptoms  Cardiovascular : Negative for cardiovascular symptoms  Respiratory : Negative for respiratory symptoms  Endocrine: Negative for endocrine symptoms  Musculoskeletal: Negative for musculoskeletal symptoms  Neurological: Negative for neurological symptoms  Psychologic: Negative for psychiatric symptoms

## 2021-02-04 LAB — BASIC METABOLIC PANEL
BUN/Creatinine Ratio: 13 (ref 10–24)
BUN: 15 mg/dL (ref 8–27)
CO2: 20 mmol/L (ref 20–29)
Calcium: 9.5 mg/dL (ref 8.6–10.2)
Chloride: 101 mmol/L (ref 96–106)
Creatinine, Ser: 1.15 mg/dL (ref 0.76–1.27)
Glucose: 100 mg/dL — ABNORMAL HIGH (ref 70–99)
Potassium: 4.1 mmol/L (ref 3.5–5.2)
Sodium: 140 mmol/L (ref 134–144)
eGFR: 69 mL/min/{1.73_m2} (ref >59)

## 2021-02-11 DIAGNOSIS — L820 Inflamed seborrheic keratosis: Secondary | ICD-10-CM | POA: Diagnosis not present

## 2021-02-11 DIAGNOSIS — D1809 Hemangioma of other sites: Secondary | ICD-10-CM | POA: Diagnosis not present

## 2021-02-11 DIAGNOSIS — M47816 Spondylosis without myelopathy or radiculopathy, lumbar region: Secondary | ICD-10-CM | POA: Diagnosis not present

## 2021-02-11 DIAGNOSIS — L918 Other hypertrophic disorders of the skin: Secondary | ICD-10-CM | POA: Diagnosis not present

## 2021-02-11 DIAGNOSIS — L821 Other seborrheic keratosis: Secondary | ICD-10-CM | POA: Diagnosis not present

## 2021-02-11 DIAGNOSIS — I781 Nevus, non-neoplastic: Secondary | ICD-10-CM | POA: Diagnosis not present

## 2021-02-11 DIAGNOSIS — M545 Low back pain, unspecified: Secondary | ICD-10-CM | POA: Diagnosis not present

## 2021-02-11 DIAGNOSIS — M9903 Segmental and somatic dysfunction of lumbar region: Secondary | ICD-10-CM | POA: Diagnosis not present

## 2021-03-04 ENCOUNTER — Ambulatory Visit (HOSPITAL_COMMUNITY)
Admission: RE | Admit: 2021-03-04 | Discharge: 2021-03-04 | Disposition: A | Discharge status: Home / Self Care | Payer: Medicare HMO | Source: Ambulatory Visit | Attending: Urology | Admitting: Urology

## 2021-03-04 ENCOUNTER — Other Ambulatory Visit: Payer: Self-pay

## 2021-03-04 DIAGNOSIS — K5730 Diverticulosis of large intestine without perforation or abscess without bleeding: Secondary | ICD-10-CM | POA: Diagnosis not present

## 2021-03-04 DIAGNOSIS — M47816 Spondylosis without myelopathy or radiculopathy, lumbar region: Secondary | ICD-10-CM | POA: Diagnosis not present

## 2021-03-04 DIAGNOSIS — R310 Gross hematuria: Secondary | ICD-10-CM | POA: Diagnosis not present

## 2021-03-04 DIAGNOSIS — M545 Low back pain, unspecified: Secondary | ICD-10-CM | POA: Diagnosis not present

## 2021-03-04 DIAGNOSIS — M9903 Segmental and somatic dysfunction of lumbar region: Secondary | ICD-10-CM | POA: Diagnosis not present

## 2021-03-04 DIAGNOSIS — N400 Enlarged prostate without lower urinary tract symptoms: Secondary | ICD-10-CM | POA: Diagnosis not present

## 2021-03-04 MED ORDER — IOHEXOL 300 MG/ML  SOLN
100.0000 mL | Freq: Once | INTRAMUSCULAR | Status: AC | PRN
  Administered 2021-03-04: 100 mL via INTRAVENOUS

## 2021-03-11 ENCOUNTER — Ambulatory Visit: Payer: Medicare HMO | Admitting: Urology

## 2021-03-11 ENCOUNTER — Encounter: Payer: Self-pay | Admitting: Urology

## 2021-03-11 ENCOUNTER — Other Ambulatory Visit: Payer: Self-pay

## 2021-03-11 VITALS — BP 136/81 | HR 62 | Wt 207.0 lb

## 2021-03-11 DIAGNOSIS — N401 Enlarged prostate with lower urinary tract symptoms: Secondary | ICD-10-CM

## 2021-03-11 DIAGNOSIS — R310 Gross hematuria: Secondary | ICD-10-CM | POA: Diagnosis not present

## 2021-03-11 DIAGNOSIS — R3914 Feeling of incomplete bladder emptying: Secondary | ICD-10-CM | POA: Diagnosis not present

## 2021-03-11 LAB — URINALYSIS, ROUTINE W REFLEX MICROSCOPIC
Bilirubin, UA: NEGATIVE
Glucose, UA: NEGATIVE
Ketones, UA: NEGATIVE
Leukocytes,UA: NEGATIVE
Nitrite, UA: NEGATIVE
Protein,UA: NEGATIVE
RBC, UA: NEGATIVE
Specific Gravity, UA: 1.025 (ref 1.005–1.030)
Urobilinogen, Ur: 0.2 mg/dL (ref 0.2–1.0)
pH, UA: 5.5 (ref 5.0–7.5)

## 2021-03-11 MED ORDER — MIRABEGRON ER 25 MG PO TB24: 25.0000 mg | ORAL_TABLET | Freq: Every day | ORAL | 0 refills | Status: DC

## 2021-03-11 MED ORDER — CIPROFLOXACIN HCL 500 MG PO TABS
500.0000 mg | ORAL_TABLET | Freq: Once | ORAL | Status: AC
  Administered 2021-03-11: 500 mg via ORAL

## 2021-03-11 NOTE — Progress Notes (Addendum)
Assessment: 1. Gross hematuria   2. Benign prostatic hyperplasia with incomplete bladder emptying     Plan: I personally reviewed the CT study from 03/04/2021.  Results discussed with the patient today. Urine cytology sent today. Cipro x1 following cystoscopy. Continue tamsulosin and tadalafil. Trial of Myrbetriq 25 mg daily.  Samples given.  Advised him to contact the office if he would like a prescription for this medication. Return to office in 6 months.  Chief Complaint:  Chief Complaint  Patient presents with   Hematuria    History of Present Illness:  Frank Noble is a 71 y.o. year old male who is seen for further evaluation of gross hematuria.  He reported a 2-day history of gross hematuria in November 2022.  This began after driving a long distance.  He noted some back pain as well.  He did have some slight increase in his dysuria at the initiation of his urine stream.  The hematuria resolved spontaneously.  No further episodes.  No prior history of gross hematuria.  No known history of kidney stones.  No history of UTIs. He was seen by his PCP in November 2022.   U/A negative, culture-no growth, PSA 2.3 The pt reports frequency, urgency, and nocturia symptoms are stable.  IPSS = 16.  PSA 2/22: 2.2 Pt satisfied with tamsulosin and Cialis which are long term Rxs for him. Urinalysis from 1/23 did not show any evidence of hematuria. CT hematuria protocol from 03/04/2021 showed no renal, ureteral, or bladder calculi, no solid renal masses, no hydronephrosis, and an enlarged prostate.  He presents now for cystoscopy. No further episodes of gross hematuria.  His urinary symptoms are unchanged.  He continues with frequency, urgency, nocturia. IPSS = 17 today.  Portions of the above documentation were copied from a prior visit for review purposes only.   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   Sleep apnea    wears CPAP   Thyroid disease    hypothyroid   Vitamin D deficiency disease 11/28/2018    Past Surgical History:  Past Surgical History:  Procedure Laterality Date   COLONOSCOPY N/A 04/05/2016   Procedure: COLONOSCOPY;  Surgeon: [name], MD;  [facility];  Service: Endoscopy;  Laterality: N/A;  8:30 Am   COLONOSCOPY  07/10/2019   2018   TONSILLECTOMY      Allergies:  Allergies  Allergen Reactions   Pollen Extract-Tree Extract [Pollen Extract]     Family History:  Family History  Problem Relation Age of Onset   Breast cancer Mother        Mets to liver   High blood pressure Father    Aneurysm Father    Esophageal cancer Maternal Grandmother    Colon cancer Neg Hx    Rectal cancer Neg Hx    Stomach cancer Neg Hx    Colon polyps Neg Hx     Social History:  Social History   Tobacco Use   Smoking status: Former    Packs/day: 0.50    Years: 10.00    Pack years: 5.00    Types:  Cigarettes   Smokeless tobacco: Never   Tobacco comments:    Quit age 62yrs  Vaping Use   Vaping Use: Never used  Substance Use Topics   Alcohol use: Yes    Alcohol/week: 8.0 standard drinks    Types: 7 Cans of beer, 1 Shots of liquor per week    Comment: everyday, at least one beer a day   Drug use: No    ROS: Constitutional:  Negative for fever, chills, weight loss CV: Negative for chest pain, previous MI, hypertension Respiratory:  Negative for shortness of breath, wheezing, sleep apnea, frequent cough GI:  Negative for nausea, vomiting, bloody stool, GERD  Physical exam: BP 136/81    Pulse 62    Wt 207 lb (93.9 kg)    BMI 30.57 kg/m  GENERAL APPEARANCE:  Well appearing, well developed,  well nourished, NAD HEENT:  Atraumatic, normocephalic, oropharynx clear NECK:  Supple without lymphadenopathy or thyromegaly ABDOMEN:  Soft, non-tender, no masses EXTREMITIES:  Moves all extremities well, without clubbing, cyanosis, or edema NEUROLOGIC:  Alert and oriented x 3, normal gait, CN II-XII grossly intact MENTAL STATUS:  appropriate BACK:  Non-tender to palpation, No CVAT SKIN:  Warm, dry, and intact   Results: U/A dipstick negative  Procedure:  Flexible Cystourethroscopy  Pre-operative Diagnosis: Gross hematuria  Post-operative Diagnosis: Gross hematuria  Anesthesia:  local with lidocaine jelly  Surgical Narrative:  After appropriate informed consent was obtained, the patient was prepped and draped in the usual sterile fashion in the supine position.  The patient was correctly identified and the proper procedure delineated prior to proceeding.  Sterile lidocaine gel was instilled in the urethra. The flexible cystoscope was introduced without difficulty.  Findings:  Anterior urethra: Normal  Posterior urethra: Lateral lobe hypertrophy  Bladder: Normal  Ureteral orifices: normal  Additional findings: None  Saline bladder wash for cytology was performed.    The cystoscope was then removed.  The patient tolerated the procedure well.

## 2021-03-11 NOTE — Addendum Note (Signed)
Addended by: Primus Bravo on: 03/11/2021 01:46 PM   Modules accepted: Orders

## 2021-03-18 DIAGNOSIS — M545 Low back pain, unspecified: Secondary | ICD-10-CM | POA: Diagnosis not present

## 2021-03-18 DIAGNOSIS — M47816 Spondylosis without myelopathy or radiculopathy, lumbar region: Secondary | ICD-10-CM | POA: Diagnosis not present

## 2021-03-18 DIAGNOSIS — M9903 Segmental and somatic dysfunction of lumbar region: Secondary | ICD-10-CM | POA: Diagnosis not present

## 2021-04-01 DIAGNOSIS — Z125 Encounter for screening for malignant neoplasm of prostate: Secondary | ICD-10-CM | POA: Diagnosis not present

## 2021-04-01 DIAGNOSIS — E039 Hypothyroidism, unspecified: Secondary | ICD-10-CM | POA: Diagnosis not present

## 2021-04-01 DIAGNOSIS — D751 Secondary polycythemia: Secondary | ICD-10-CM | POA: Diagnosis not present

## 2021-04-01 DIAGNOSIS — I10 Essential (primary) hypertension: Secondary | ICD-10-CM | POA: Diagnosis not present

## 2021-04-01 DIAGNOSIS — K760 Fatty (change of) liver, not elsewhere classified: Secondary | ICD-10-CM | POA: Diagnosis not present

## 2021-04-02 DIAGNOSIS — M545 Low back pain, unspecified: Secondary | ICD-10-CM | POA: Diagnosis not present

## 2021-04-02 DIAGNOSIS — M9903 Segmental and somatic dysfunction of lumbar region: Secondary | ICD-10-CM | POA: Diagnosis not present

## 2021-04-02 DIAGNOSIS — M47816 Spondylosis without myelopathy or radiculopathy, lumbar region: Secondary | ICD-10-CM | POA: Diagnosis not present

## 2021-04-06 ENCOUNTER — Encounter: Payer: Self-pay | Admitting: Urology

## 2021-04-06 DIAGNOSIS — K760 Fatty (change of) liver, not elsewhere classified: Secondary | ICD-10-CM | POA: Diagnosis not present

## 2021-04-06 DIAGNOSIS — Z125 Encounter for screening for malignant neoplasm of prostate: Secondary | ICD-10-CM | POA: Diagnosis not present

## 2021-04-06 DIAGNOSIS — D751 Secondary polycythemia: Secondary | ICD-10-CM | POA: Diagnosis not present

## 2021-04-06 DIAGNOSIS — I10 Essential (primary) hypertension: Secondary | ICD-10-CM | POA: Diagnosis not present

## 2021-04-06 DIAGNOSIS — E039 Hypothyroidism, unspecified: Secondary | ICD-10-CM | POA: Diagnosis not present

## 2021-04-06 NOTE — Telephone Encounter (Signed)
Please advise on patient message.

## 2021-04-09 DIAGNOSIS — R69 Illness, unspecified: Secondary | ICD-10-CM | POA: Diagnosis not present

## 2021-04-13 DIAGNOSIS — I10 Essential (primary) hypertension: Secondary | ICD-10-CM | POA: Diagnosis not present

## 2021-04-13 DIAGNOSIS — Z1389 Encounter for screening for other disorder: Secondary | ICD-10-CM | POA: Diagnosis not present

## 2021-04-13 DIAGNOSIS — E039 Hypothyroidism, unspecified: Secondary | ICD-10-CM | POA: Diagnosis not present

## 2021-04-13 DIAGNOSIS — K760 Fatty (change of) liver, not elsewhere classified: Secondary | ICD-10-CM | POA: Diagnosis not present

## 2021-04-13 DIAGNOSIS — Z1331 Encounter for screening for depression: Secondary | ICD-10-CM | POA: Diagnosis not present

## 2021-04-13 DIAGNOSIS — N401 Enlarged prostate with lower urinary tract symptoms: Secondary | ICD-10-CM | POA: Diagnosis not present

## 2021-04-13 DIAGNOSIS — N529 Male erectile dysfunction, unspecified: Secondary | ICD-10-CM | POA: Diagnosis not present

## 2021-04-13 DIAGNOSIS — D751 Secondary polycythemia: Secondary | ICD-10-CM | POA: Diagnosis not present

## 2021-04-22 ENCOUNTER — Inpatient Hospital Stay (HOSPITAL_COMMUNITY): Payer: Medicare HMO | Attending: Hematology

## 2021-04-22 DIAGNOSIS — Z803 Family history of malignant neoplasm of breast: Secondary | ICD-10-CM | POA: Diagnosis not present

## 2021-04-22 DIAGNOSIS — Z148 Genetic carrier of other disease: Secondary | ICD-10-CM | POA: Insufficient documentation

## 2021-04-22 DIAGNOSIS — I10 Essential (primary) hypertension: Secondary | ICD-10-CM | POA: Diagnosis not present

## 2021-04-22 DIAGNOSIS — G4730 Sleep apnea, unspecified: Secondary | ICD-10-CM | POA: Insufficient documentation

## 2021-04-22 DIAGNOSIS — Z87891 Personal history of nicotine dependence: Secondary | ICD-10-CM | POA: Diagnosis not present

## 2021-04-22 DIAGNOSIS — Z79899 Other long term (current) drug therapy: Secondary | ICD-10-CM | POA: Diagnosis not present

## 2021-04-22 DIAGNOSIS — E039 Hypothyroidism, unspecified: Secondary | ICD-10-CM | POA: Insufficient documentation

## 2021-04-22 DIAGNOSIS — E538 Deficiency of other specified B group vitamins: Secondary | ICD-10-CM | POA: Insufficient documentation

## 2021-04-22 DIAGNOSIS — D751 Secondary polycythemia: Secondary | ICD-10-CM | POA: Insufficient documentation

## 2021-04-22 DIAGNOSIS — Z8249 Family history of ischemic heart disease and other diseases of the circulatory system: Secondary | ICD-10-CM | POA: Diagnosis not present

## 2021-04-22 DIAGNOSIS — E559 Vitamin D deficiency, unspecified: Secondary | ICD-10-CM | POA: Diagnosis not present

## 2021-04-22 DIAGNOSIS — K760 Fatty (change of) liver, not elsewhere classified: Secondary | ICD-10-CM | POA: Diagnosis not present

## 2021-04-22 DIAGNOSIS — D696 Thrombocytopenia, unspecified: Secondary | ICD-10-CM | POA: Insufficient documentation

## 2021-04-22 LAB — CBC WITH DIFFERENTIAL/PLATELET
Abs Immature Granulocytes: 0.01 10*3/uL (ref 0.00–0.07)
Basophils Absolute: 0 10*3/uL (ref 0.0–0.1)
Basophils Relative: 1 %
Eosinophils Absolute: 0.2 10*3/uL (ref 0.0–0.5)
Eosinophils Relative: 4 %
HCT: 48.9 % (ref 39.0–52.0)
Hemoglobin: 16.8 g/dL (ref 13.0–17.0)
Immature Granulocytes: 0 %
Lymphocytes Relative: 33 %
Lymphs Abs: 1.5 10*3/uL (ref 0.7–4.0)
MCH: 32.9 pg (ref 26.0–34.0)
MCHC: 34.4 g/dL (ref 30.0–36.0)
MCV: 95.7 fL (ref 80.0–100.0)
Monocytes Absolute: 0.5 10*3/uL (ref 0.1–1.0)
Monocytes Relative: 12 %
Neutro Abs: 2.2 10*3/uL (ref 1.7–7.7)
Neutrophils Relative %: 50 %
Platelets: 171 10*3/uL (ref 150–400)
RBC: 5.11 MIL/uL (ref 4.22–5.81)
RDW: 12.1 % (ref 11.5–15.5)
WBC: 4.5 10*3/uL (ref 4.0–10.5)
nRBC: 0 % (ref 0.0–0.2)

## 2021-04-22 LAB — VITAMIN B12: Vitamin B-12: 883 pg/mL (ref 180–914)

## 2021-04-22 LAB — FERRITIN: Ferritin: 144 ng/mL (ref 24–336)

## 2021-04-22 LAB — IRON AND TIBC
Iron: 95 ug/dL (ref 45–182)
Saturation Ratios: 44 % — ABNORMAL HIGH (ref 17.9–39.5)
TIBC: 216 ug/dL — ABNORMAL LOW (ref 250–450)
UIBC: 121 ug/dL

## 2021-04-22 LAB — VITAMIN D 25 HYDROXY (VIT D DEFICIENCY, FRACTURES): Vit D, 25-Hydroxy: 52.74 ng/mL (ref 30–100)

## 2021-04-24 LAB — METHYLMALONIC ACID, SERUM: Methylmalonic Acid, Quantitative: 175 nmol/L (ref 0–378)

## 2021-04-28 NOTE — Progress Notes (Signed)
? ?[place] ?[address] ?[place], [place] [address] ? ? ?CLINIC:  ?Medical Oncology/Hematology ? ?PCP:  ?Doree Albee, MD (Inactive) ?No address on file ?None ? ? ?REASON FOR VISIT:  ?Follow-up for thrombocytopenia and secondary polycythemia ?  ?PRIOR THERAPY: None ?  ?CURRENT THERAPY: Intermittent phlebotomy ? ?INTERVAL HISTORY:  ?Frank Noble 71 y.o. male returns for routine follow-up of thrombocytopenia and secondary polycythemia.  He was last evaluated by Tarri Abernethy PA-C via telemedicine visit on 01/01/2021. ? ?At today's visit, he reports feeling fairly well.  No recent hospitalizations, surgeries, or changes in baseline health status. ? ?He continues to take testosterone supplement, which was restarted in December 2022. ?He has not had any phlebotomies since his last visit.  He denies any significant fatigue.  No signs or symptoms of blood clots.  No aquagenic pruritus, Raynaud's phenomenon, erythromelalgia, or vasomotor symptoms. ? ?He denies any recent issues with bruising, bleeding, or petechial rash.  He had an episode of gross hematuria in November 2022, with negative urological work-up, reports that he has not had any recurrent episodes since that time. ?No B symptoms such as fever, chills, night sweats, unintentional weight loss. ? ?He has 80% energy and 70% appetite. He endorses that he is maintaining a stable weight. ? ? ?REVIEW OF SYSTEMS:  ?Review of Systems  ?Constitutional:  Negative for appetite change, chills, diaphoresis, fatigue, fever and unexpected weight change.  ?HENT:   Negative for lump/mass and nosebleeds.   ?Eyes:  Negative for eye problems.  ?Respiratory:  Negative for cough, hemoptysis and shortness of breath.   ?Cardiovascular:  Negative for chest pain, leg swelling and palpitations.  ?Gastrointestinal:  Negative for abdominal pain, blood in stool, constipation, diarrhea, nausea and vomiting.  ?Genitourinary:  Negative for hematuria.   ?Skin: Negative.    ?Neurological:  Negative for dizziness, headaches and light-headedness.  ?Hematological:  Does not bruise/bleed easily.   ? ? ?PAST MEDICAL/SURGICAL HISTORY:  ?Past Medical History:  ?Diagnosis Date  ? Allergy   ? Arthritis   ? back  ? BPH (benign prostatic hyperplasia) 11/28/2018  ? Cataract   ? Colon adenomas 03/2016  ? 5, polyps  ? GERD (gastroesophageal reflux disease)   ? Heart murmur   ? HLD (hyperlipidemia) 11/28/2018  ? Hypercholesteremia   ? Hypertension   ? 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 Sleep apnea   ? wears CPAP  ? Thyroid disease   ? hypothyroid  ? Vitamin D deficiency disease 11/28/2018  ? ?Past Surgical History:  ?Procedure Laterality Date  ? COLONOSCOPY N/A 04/05/2016  ? Procedure: COLONOSCOPY;  Surgeon: [name], MD;  [facility];  Service: Endoscopy;  Laterality: N/A;  8:30 Am  ? COLONOSCOPY  07/10/2019  ? 2018  ? TONSILLECTOMY    ? ? ? ?SOCIAL HISTORY:  ?Social History  ? ?Socioeconomic History  ? Marital status: Married  ?  Spouse name: Not on file  ? Number of children: 3  ? Years of education: Not on file  ? Highest education level: Not on file  ?Occupational History  ? Occupation: retired  ?Tobacco Use  ? Smoking status: Former  ?  Packs/day: 0.50  ?  Years: 10.00  ?  Pack years: 5.00  ?  Types: Cigarettes  ? Smokeless tobacco: Never  ? Tobacco comments:  ?  Quit age 8yr  ?Vaping Use  ? Vaping Use: Never used  ?Substance and Sexual Activity  ? Alcohol use: Yes  ?  Alcohol/week: 8.0 standard drinks  ?  Types: 7 Cans of beer, 1 Shots of liquor per week  ?  Comment: everyday, at least one beer a day  ? Drug use: No  ? Sexual activity: Not on file  ?Other Topics Concern  ? Not on file  ?Social History Narrative  ?   ?   ? Married for 42 years.Lives with wife.Retired since Dec 2020..Originally from [place],in [place] years.   ? ?Social Determinants of Health  ? ?Financial Resource Strain: Low Risk   ? Difficulty of Paying Living Expenses: Not hard at all  ?Food  Insecurity: No Food Insecurity  ? Worried About Charity fundraiser in the Last Year: Never true  ? Ran Out of Food in the Last Year: Never true  ?Transportation Needs: No Transportation Needs  ? Lack of Transportation (Medical): No  ? Lack of Transportation (Non-Medical): No  ?Physical Activity: Insufficiently Active  ? Days of Exercise per Week: 3 days  ? Minutes of Exercise per Session: 30 min  ?Stress: No Stress Concern Present  ? Feeling of Stress : Only a little  ?Social Connections: Moderately Isolated  ? Frequency of Communication with Friends and Family: More than three times a week  ? Frequency of Social Gatherings with Friends and Family: Never  ? Attends Religious Services: Never  ? Active Member of Clubs or Organizations: No  ? Attends Archivist Meetings: Never  ? Marital Status: Married  ?Intimate Partner Violence: Not At Risk  ? Fear of Current or Ex-Partner: No  ? Emotionally Abused: No  ? Physically Abused: No  ? Sexually Abused: No  ? ? ?FAMILY HISTORY:  ?Family History  ?Problem Relation Age of Onset  ? Breast cancer Mother   ?     Mets to liver  ? High blood pressure Father   ? Aneurysm Father   ? Esophageal cancer Maternal Grandmother   ? Colon cancer Neg Hx   ? Rectal cancer Neg Hx   ? Stomach cancer Neg Hx   ? Colon polyps Neg Hx   ? ? ?CURRENT MEDICATIONS:  ?Outpatient Encounter Medications as of 04/29/2021  ?Medication Sig  ? amLODipine (NORVASC) 5 MG tablet TAKE 1 TABLET (5 MG TOTAL) BY MOUTH DAILY.  ? Cetirizine HCl (ZYRTEC PO) Take by mouth as needed.  ? chlorhexidine (PERIDEX) 0.12 % solution   ? Cholecalciferol (VITAMIN D-3) 5000 units TABS Take 10,000 Units by mouth daily.   ? Cyanocobalamin (B-12) 1000 MCG TABS   ? mirabegron ER (MYRBETRIQ) 25 MG TB24 tablet Take 1 tablet (25 mg total) by mouth daily.  ? NP THYROID 90 MG tablet Take 1 tablet (90 mg total) by mouth daily.  ? omeprazole (PRILOSEC) 20 MG capsule 1 capsule 30 minutes before breakfast as needed for  reflux/indigestion  ? tadalafil (CIALIS) 5 MG tablet TAKE ONE TABLET BY MOUTH DAILY  ? tamsulosin (FLOMAX) 0.4 MG CAPS capsule TAKE 1 CAPSULE DAILY  ? testosterone cypionate (DEPOTESTOSTERONE CYPIONATE) 200 MG/ML injection INJECT 0.5 MLS (100 MG TOTAL) INTO THE MUSCLE EVERY 7 (SEVEN) DAYS. INJECTS 0.5ML ON SUNDAYS.  ? ?No facility-administered encounter medications on file as of 04/29/2021.  ? ? ?ALLERGIES:  ?Allergies  ?Allergen Reactions  ? Pollen Extract-Tree Extract [Pollen Extract]   ? ? ? ?PHYSICAL EXAM:  ?ECOG PERFORMANCE STATUS: 0 - Asymptomatic ? ?There were no vitals filed for this visit. ?There were no vitals filed for this visit. ?Physical Exam ? ? ?LABORATORY DATA:  ?  I have reviewed the labs as listed.  ?CBC ?   ?Component Value Date/Time  ? WBC 4.5 04/22/2021 1048  ? RBC 5.11 04/22/2021 1048  ? HGB 16.8 04/22/2021 1048  ? HCT 48.9 04/22/2021 1048  ? PLT 171 04/22/2021 1048  ? MCV 95.7 04/22/2021 1048  ? MCH 32.9 04/22/2021 1048  ? MCHC 34.4 04/22/2021 1048  ? RDW 12.1 04/22/2021 1048  ? LYMPHSABS 1.5 04/22/2021 1048  ? MONOABS 0.5 04/22/2021 1048  ? EOSABS 0.2 04/22/2021 1048  ? BASOSABS 0.0 04/22/2021 1048  ? ? ?  Latest Ref Rng & Units 02/03/2021  ? 12:00 AM 02/26/2020  ?  9:36 AM 08/28/2019  ? 10:35 AM  ?CMP  ?Glucose 70 - 99 mg/dL 100   88   88    ?BUN 8 - 27 mg/dL '15   13   16    '$ ?Creatinine 0.76 - 1.27 mg/dL 1.15   1.21   1.33    ?Sodium 134 - 144 mmol/L 140   143   141    ?Potassium 3.5 - 5.2 mmol/L 4.1   4.3   4.3    ?Chloride 96 - 106 mmol/L 101   108   105    ?CO2 20 - 29 mmol/L '20   28   29    '$ ?Calcium 8.6 - 10.2 mg/dL 9.5   9.1   8.8    ?Total Protein 6.1 - 8.1 g/dL  5.9   6.0    ?Total Bilirubin 0.2 - 1.2 mg/dL  0.8   1.0    ?AST 10 - 35 U/L  25   28    ?ALT 9 - 46 U/L  33   35    ? ? ?DIAGNOSTIC IMAGING:  ?I have independently reviewed the relevant imaging and discussed with the patient. ? ?ASSESSMENT & PLAN: ?1.  Thrombocytopenia, mild ?- Mild thrombocytopenia since at least March 2021,  platelets ranging from around 120-150 ?- Denies any easy bruising or bleeding.   ?- Ultrasound of the abdomen on 04/25/2019 shows normal spleen but the liver consistent with fatty infiltration.  ?- Most recent labs (04/22/2021)

## 2021-04-29 ENCOUNTER — Inpatient Hospital Stay (HOSPITAL_COMMUNITY): Payer: Medicare HMO | Admitting: Physician Assistant

## 2021-04-29 VITALS — BP 126/78 | HR 64 | Temp 97.6°F | Resp 18 | Ht 69.0 in | Wt 210.1 lb

## 2021-04-29 DIAGNOSIS — E559 Vitamin D deficiency, unspecified: Secondary | ICD-10-CM | POA: Diagnosis not present

## 2021-04-29 DIAGNOSIS — E039 Hypothyroidism, unspecified: Secondary | ICD-10-CM | POA: Diagnosis not present

## 2021-04-29 DIAGNOSIS — Z87891 Personal history of nicotine dependence: Secondary | ICD-10-CM | POA: Diagnosis not present

## 2021-04-29 DIAGNOSIS — E538 Deficiency of other specified B group vitamins: Secondary | ICD-10-CM | POA: Diagnosis not present

## 2021-04-29 DIAGNOSIS — D696 Thrombocytopenia, unspecified: Secondary | ICD-10-CM | POA: Diagnosis not present

## 2021-04-29 DIAGNOSIS — D751 Secondary polycythemia: Secondary | ICD-10-CM

## 2021-04-29 DIAGNOSIS — K760 Fatty (change of) liver, not elsewhere classified: Secondary | ICD-10-CM | POA: Diagnosis not present

## 2021-04-29 DIAGNOSIS — Z148 Genetic carrier of other disease: Secondary | ICD-10-CM | POA: Diagnosis not present

## 2021-04-29 DIAGNOSIS — G4730 Sleep apnea, unspecified: Secondary | ICD-10-CM | POA: Diagnosis not present

## 2021-04-29 NOTE — Patient Instructions (Signed)
[place] at [facility] ?Discharge Instructions ? ?You were seen today by Tarri Abernethy PA-C for your elevated hemoglobin counts, low platelets, and other conditions noted below. ? ?THROMBOCYTOPENIA: Your low platelets are most likely due to fatty liver disease.  We will continue to monitor them with periodic CBC. ? ?POLYCYTHEMIA: Your elevated hemoglobin/hematocrit (blood counts) is related to testosterone injections.  ?- I recommend that you donate blood in about 3 months to keep your blood at a safe level. ?- I recommend that you start taking aspirin 81 mg daily to decrease the risk of heart attack, stroke, and blood clots from your elevated red blood cells, since you also have one other cardiac risk factor (high cholesterol). ? ?FOLLOW-UP APPOINTMENT: Labs in 6 months followed by phone visit. ? ? ?Thank you for choosing [place] at [facility] to provide your oncology and hematology care.  To afford each patient quality time with our provider, please arrive at least 15 minutes before your scheduled appointment time.  ? ?If you have a lab appointment with the [facility] please come in thru the Main Entrance and check in at the main information desk. ? ?You need to re-schedule your appointment should you arrive 10 or more minutes late.  We strive to give you quality time with our providers, and arriving late affects you and other patients whose appointments are after yours.  Also, if you no show three or more times for appointments you may be dismissed from the clinic at the providers discretion.     ?Again, thank you for choosing [facility].  Our hope is that these requests will decrease the amount of time that you wait before being seen by our physicians.       ?_____________________________________________________________ ? ?Should you have questions after your visit to [facility], please contact our office at [phone]  and follow the prompts.  Our office hours are 8:00 a.m. and 4:30 p.m. Monday - Friday.  Please note that voicemails left after 4:00 p.m. may not be returned until the following business day.  We are closed weekends and major holidays.  You do have access to a nurse 24-7, just call the main number to the clinic [phone] and do not press any options, hold on the line and a nurse will answer the phone.   ? ?For prescription refill requests, have your pharmacy contact our office and allow 72 hours.   ? ?Due to Covid, you will need to wear a mask upon entering the hospital. If you do not have a mask, a mask will be given to you at the Main Entrance upon arrival. For doctor visits, patients may have 1 support person age 37 or older with them. For treatment visits, patients can not have anyone with them due to social distancing guidelines and our immunocompromised population.  ? ? ? ?

## 2021-06-24 DIAGNOSIS — I10 Essential (primary) hypertension: Secondary | ICD-10-CM | POA: Diagnosis not present

## 2021-06-24 DIAGNOSIS — R052 Subacute cough: Secondary | ICD-10-CM | POA: Diagnosis not present

## 2021-06-24 DIAGNOSIS — J3089 Other allergic rhinitis: Secondary | ICD-10-CM | POA: Diagnosis not present

## 2021-06-29 ENCOUNTER — Other Ambulatory Visit: Payer: Self-pay | Admitting: Gastroenterology

## 2021-07-03 DIAGNOSIS — H11442 Conjunctival cysts, left eye: Secondary | ICD-10-CM | POA: Diagnosis not present

## 2021-07-09 DIAGNOSIS — R5383 Other fatigue: Secondary | ICD-10-CM | POA: Diagnosis not present

## 2021-07-09 DIAGNOSIS — E7800 Pure hypercholesterolemia, unspecified: Secondary | ICD-10-CM | POA: Diagnosis not present

## 2021-07-09 DIAGNOSIS — I10 Essential (primary) hypertension: Secondary | ICD-10-CM | POA: Diagnosis not present

## 2021-07-09 DIAGNOSIS — R319 Hematuria, unspecified: Secondary | ICD-10-CM | POA: Diagnosis not present

## 2021-07-09 DIAGNOSIS — N183 Chronic kidney disease, stage 3 unspecified: Secondary | ICD-10-CM | POA: Diagnosis not present

## 2021-07-09 DIAGNOSIS — R739 Hyperglycemia, unspecified: Secondary | ICD-10-CM | POA: Diagnosis not present

## 2021-07-09 DIAGNOSIS — D751 Secondary polycythemia: Secondary | ICD-10-CM | POA: Diagnosis not present

## 2021-07-09 DIAGNOSIS — E039 Hypothyroidism, unspecified: Secondary | ICD-10-CM | POA: Diagnosis not present

## 2021-07-09 DIAGNOSIS — E7801 Familial hypercholesterolemia: Secondary | ICD-10-CM | POA: Diagnosis not present

## 2021-07-10 DIAGNOSIS — H1045 Other chronic allergic conjunctivitis: Secondary | ICD-10-CM | POA: Diagnosis not present

## 2021-07-14 DIAGNOSIS — N529 Male erectile dysfunction, unspecified: Secondary | ICD-10-CM | POA: Diagnosis not present

## 2021-07-14 DIAGNOSIS — E039 Hypothyroidism, unspecified: Secondary | ICD-10-CM | POA: Diagnosis not present

## 2021-07-14 DIAGNOSIS — D751 Secondary polycythemia: Secondary | ICD-10-CM | POA: Diagnosis not present

## 2021-07-14 DIAGNOSIS — K760 Fatty (change of) liver, not elsewhere classified: Secondary | ICD-10-CM | POA: Diagnosis not present

## 2021-07-14 DIAGNOSIS — N401 Enlarged prostate with lower urinary tract symptoms: Secondary | ICD-10-CM | POA: Diagnosis not present

## 2021-07-14 DIAGNOSIS — I10 Essential (primary) hypertension: Secondary | ICD-10-CM | POA: Diagnosis not present

## 2021-07-14 DIAGNOSIS — E7849 Other hyperlipidemia: Secondary | ICD-10-CM | POA: Diagnosis not present

## 2021-07-14 DIAGNOSIS — I872 Venous insufficiency (chronic) (peripheral): Secondary | ICD-10-CM | POA: Diagnosis not present

## 2021-07-14 DIAGNOSIS — N183 Chronic kidney disease, stage 3 unspecified: Secondary | ICD-10-CM | POA: Diagnosis not present

## 2021-07-17 DIAGNOSIS — H11442 Conjunctival cysts, left eye: Secondary | ICD-10-CM | POA: Diagnosis not present

## 2021-07-31 ENCOUNTER — Encounter (HOSPITAL_COMMUNITY): Payer: Self-pay

## 2021-07-31 ENCOUNTER — Inpatient Hospital Stay (HOSPITAL_COMMUNITY): Payer: Medicare HMO | Attending: Hematology

## 2021-07-31 DIAGNOSIS — D696 Thrombocytopenia, unspecified: Secondary | ICD-10-CM | POA: Diagnosis not present

## 2021-07-31 DIAGNOSIS — K760 Fatty (change of) liver, not elsewhere classified: Secondary | ICD-10-CM | POA: Diagnosis not present

## 2021-07-31 DIAGNOSIS — Z803 Family history of malignant neoplasm of breast: Secondary | ICD-10-CM | POA: Diagnosis not present

## 2021-07-31 DIAGNOSIS — E039 Hypothyroidism, unspecified: Secondary | ICD-10-CM | POA: Insufficient documentation

## 2021-07-31 DIAGNOSIS — E538 Deficiency of other specified B group vitamins: Secondary | ICD-10-CM | POA: Insufficient documentation

## 2021-07-31 DIAGNOSIS — Z87891 Personal history of nicotine dependence: Secondary | ICD-10-CM | POA: Insufficient documentation

## 2021-07-31 DIAGNOSIS — Z79899 Other long term (current) drug therapy: Secondary | ICD-10-CM | POA: Insufficient documentation

## 2021-07-31 DIAGNOSIS — E559 Vitamin D deficiency, unspecified: Secondary | ICD-10-CM | POA: Diagnosis not present

## 2021-07-31 DIAGNOSIS — D751 Secondary polycythemia: Secondary | ICD-10-CM | POA: Insufficient documentation

## 2021-07-31 DIAGNOSIS — Z148 Genetic carrier of other disease: Secondary | ICD-10-CM | POA: Diagnosis not present

## 2021-07-31 DIAGNOSIS — G4730 Sleep apnea, unspecified: Secondary | ICD-10-CM | POA: Diagnosis not present

## 2021-07-31 DIAGNOSIS — I10 Essential (primary) hypertension: Secondary | ICD-10-CM | POA: Diagnosis not present

## 2021-07-31 DIAGNOSIS — Z8249 Family history of ischemic heart disease and other diseases of the circulatory system: Secondary | ICD-10-CM | POA: Insufficient documentation

## 2021-07-31 NOTE — Progress Notes (Signed)
Frank Noble presents today for phlebotomy per MD orders. Phlebotomy procedure started at 1120 and ended at 1127. 500 cc removed. Patient tolerated procedure well. IV needle removed intact.  Patient tolerated phlebotomy with no complaints voiced.  Peripheral IV site intact with no bruising or swelling noted.  Denied SOB, chest pain, or dizziness.  Gauze with coban applied.  Discharged with VSS and no s/s of distress noted.

## 2021-07-31 NOTE — Patient Instructions (Signed)
[address]  Discharge Instructions: Thank you for choosing [facility] to provide your oncology and hematology care.  If you have a lab appointment with the [facility], please come in thru the Main Entrance and check in at the main information desk.  Wear comfortable clothing and clothing appropriate for easy access to any Portacath or PICC line.   We strive to give you quality time with your provider. You may need to reschedule your appointment if you arrive late (15 or more minutes).  Arriving late affects you and other patients whose appointments are after yours.  Also, if you miss three or more appointments without notifying the office, you may be dismissed from the clinic at the provider's discretion.      For prescription refill requests, have your pharmacy contact our office and allow 72 hours for refills to be completed.    Therapeutic Phlebotomy, Care After The following information offers guidance on how to care for yourself after your procedure. Your health care provider may also give you more specific instructions. If you have problems or questions, contact your health care provider. What can I expect after the procedure? After therapeutic phlebotomy, it is common to have: Light-headedness or dizziness. You may feel faint. Nausea. Tiredness (fatigue). Follow these instructions at home: Eating and drinking Be sure to eat well-balanced meals for the next 24 hours. Drink enough fluid to keep your urine pale yellow. Avoid drinking alcohol on the day that you had the procedure. Activity  Return to your normal activities as told by your health care provider. Most people can go back to their normal activities right away. Avoid activities that take a lot of effort for about 5 hours after the procedure. Athletes should avoid strenuous exercise for at least 12 hours. Avoid heavy lifting or pulling for about 5 hours after the procedure. Do not lift anything  that is heavier than 10 lb (4.5 kg). Change positions slowly for the remainder of the day, like from sitting to standing. This can help prevent light-headedness or fainting. If you feel light-headed, lie down until the feeling goes away. Needle insertion site care  Keep your bandage (dressing) dry. You can remove the bandage after about 5 hours or as told by your health care provider. If you have bleeding from the needle insertion site, raise (elevate) your arm and press firmly on the site until the bleeding stops. If you have bruising at the site, apply ice to the area. To do this: Put ice in a plastic bag. Place a towel between your skin and the bag. Leave the ice on for 20 minutes, 2-3 times a day for the first 24 hours. Remove the ice if your skin turns bright red so you do not damage the area. If the swelling does not go away after 24 hours, apply a warm, moist cloth (warm compress) to the area for 20 minutes, 2-3 times a day. General instructions Do not use any products that contain nicotine or tobacco, like cigarettes, chewing tobacco, and vaping devices, such as e-cigarettes, for at least 30 minutes after the procedure. If you need help quitting, ask your health care provider. Keep all follow-up visits. You may need to continue having regular blood tests and therapeutic phlebotomy treatments as directed. Contact a health care provider if: You have redness, swelling, or pain at the needle insertion site. Fluid or blood is coming from the needle insertion site. Pus or a bad smell is coming from the  needle insertion site. The needle insertion site feels warm to the touch. You feel light-headed, dizzy, or nauseous, and the feeling does not go away. You have new bruising at the needle insertion site. You feel weaker than normal. You have a fever or chills. Get help right away if: You have chest pain. You have trouble breathing. You have severe nausea or vomiting. Summary After the  procedure, it is common to have some light-headedness, dizziness, nausea, or tiredness (fatigue). Be sure to eat well-balanced meals for the next 24 hours. Drink enough fluid to keep your urine pale yellow. Return to your normal activities as told by your health care provider. Keep all follow-up visits. You may need to continue having regular blood tests and therapeutic phlebotomy treatments as directed. This information is not intended to replace advice given to you by your health care provider. Make sure you discuss any questions you have with your health care provider. Document Revised: 07/02/2020 Document Reviewed: 07/02/2020 Elsevier Patient Education  [employer].    To help prevent nausea and vomiting after your treatment, we encourage you to take your nausea medication as directed.  BELOW ARE SYMPTOMS THAT SHOULD BE REPORTED IMMEDIATELY: *FEVER GREATER THAN 100.4 F (38 C) OR HIGHER *CHILLS OR SWEATING *NAUSEA AND VOMITING THAT IS NOT CONTROLLED WITH YOUR NAUSEA MEDICATION *UNUSUAL SHORTNESS OF BREATH *UNUSUAL BRUISING OR BLEEDING *URINARY PROBLEMS (pain or burning when urinating, or frequent urination) *BOWEL PROBLEMS (unusual diarrhea, constipation, pain near the anus) TENDERNESS IN MOUTH AND THROAT WITH OR WITHOUT PRESENCE OF ULCERS (sore throat, sores in mouth, or a toothache) UNUSUAL RASH, SWELLING OR PAIN  UNUSUAL VAGINAL DISCHARGE OR ITCHING   Items with * indicate a potential emergency and should be followed up as soon as possible or go to the Emergency Department if any problems should occur.  Please show the CHEMOTHERAPY ALERT CARD or IMMUNOTHERAPY ALERT CARD at check-in to the Emergency Department and triage nurse.  Should you have questions after your visit or need to cancel or reschedule your appointment, please contact [facility] [phone]  and follow the prompts.  Office hours are 8:00 a.m. to 4:30 p.m. Monday - Friday. Please note that  voicemails left after 4:00 p.m. may not be returned until the following business day.  We are closed weekends and major holidays. You have access to a nurse at all times for urgent questions. Please call the main number to the clinic [phone] and follow the prompts.  For any non-urgent questions, you may also contact your provider using MyChart. We now offer e-Visits for anyone 49 and older to request care online for non-urgent symptoms. For details visit [email or web address].   Also download the MyChart app! Go to the app store, search "MyChart", open the app, select [facility], and log in with your MyChart username and password.  Masks are optional in the cancer centers. If you would like for your care team to wear a mask while they are taking care of you, please let them know. For doctor visits, patients may have with them one support person who is at least 70 years old. At this time, visitors are not allowed in the infusion area.

## 2021-08-24 DIAGNOSIS — E039 Hypothyroidism, unspecified: Secondary | ICD-10-CM | POA: Diagnosis not present

## 2021-09-08 ENCOUNTER — Ambulatory Visit: Payer: Medicare HMO | Admitting: Urology

## 2021-10-19 ENCOUNTER — Other Ambulatory Visit: Payer: Self-pay | Admitting: *Deleted

## 2021-10-19 DIAGNOSIS — D696 Thrombocytopenia, unspecified: Secondary | ICD-10-CM

## 2021-10-19 DIAGNOSIS — D751 Secondary polycythemia: Secondary | ICD-10-CM

## 2021-10-29 ENCOUNTER — Other Ambulatory Visit (HOSPITAL_COMMUNITY): Payer: Medicare HMO

## 2021-11-06 ENCOUNTER — Other Ambulatory Visit: Payer: Medicare HMO

## 2021-11-06 ENCOUNTER — Telehealth (HOSPITAL_COMMUNITY): Payer: Medicare HMO | Admitting: Physician Assistant

## 2021-11-10 ENCOUNTER — Encounter: Payer: Self-pay | Admitting: *Deleted

## 2021-11-13 ENCOUNTER — Telehealth: Payer: Medicare HMO | Admitting: Physician Assistant

## 2021-12-03 ENCOUNTER — Telehealth: Payer: Medicare HMO | Admitting: Physician Assistant

## 2021-12-04 NOTE — Progress Notes (Unsigned)
Virtual Visit via Telephone Note [facility]  I connected with Frank Noble  on 12/07/21 at  3:25 PM by telephone and verified that I am speaking with the correct person using two identifiers.  Location: Patient: Home Provider: [facility]   I discussed the limitations, risks, security and privacy concerns of performing an evaluation and management service by telephone and the availability of in person appointments. I also discussed with the patient that there may be a patient responsible charge related to this service. The patient expressed understanding and agreed to proceed.  REASON FOR VISIT:  Follow-up for thrombocytopenia and secondary polycythemia   PRIOR THERAPY: None   CURRENT THERAPY: Intermittent phlebotomy   INTERVAL HISTORY:  Frank Noble 71 y.o. male returns for routine follow-up of thrombocytopenia and secondary polycythemia.  He was last seen by Tarri Abernethy PA-C on 04/29/2021.   At today's visit, he reports feeling well.  No recent hospitalizations, surgeries, or changes in baseline health status.   He continues to take testosterone supplement, which was restarted in December 2022.  He had phlebotomy on 07/31/2021.   He denies any significant fatigue.  No signs or symptoms of blood clots.  No aquagenic pruritus, Raynaud's phenomenon, erythromelalgia, or vasomotor symptoms.  He has chronic bilateral peripheral edema, with left chronically greater than right; no acute changes concerning for DVT.   He denies any recent issues with bruising, bleeding, or petechial rash.   He had an episode of gross hematuria in November 2022, with negative urological work-up, reports that he has not had any recurrent episodes since that time.  No B symptoms such as fever, chills, night sweats, unintentional weight loss.   He has 100% energy and 100% appetite. He endorses that he is maintaining a stable weight.    OBSERVATIONS/OBJECTIVE: Review of  Systems  Constitutional:  Negative for chills, diaphoresis, fever, malaise/fatigue and weight loss.  Respiratory:  Negative for cough and shortness of breath.   Cardiovascular:  Positive for leg swelling (chronic, at baseline). Negative for chest pain and palpitations.  Gastrointestinal:  Negative for abdominal pain, blood in stool, melena, nausea and vomiting.  Neurological:  Negative for dizziness and headaches.     PHYSICAL EXAM (per limitations of virtual telephone visit): The patient is alert and oriented x 3, exhibiting adequate mentation, good mood, and ability to speak in full sentences and execute sound judgement.   ASSESSMENT & PLAN: 1.  Thrombocytopenia, mild - Mild thrombocytopenia since at least March 2021, platelets ranging from around 120-150 - Denies any easy bruising or bleeding.   - Ultrasound of the abdomen on 04/25/2019 shows normal spleen but the liver consistent with fatty infiltration.  - Most recent labs (11/11/2021): Platelets 154 - Suspect mild thrombocytopenia secondary to fatty liver infiltration.  Differential diagnosis also includes chronic immune mediated thrombocytopenia versus early MDS. - PLAN: No further work-up or treatment needed at this time.  We will continue to monitor with periodic CBCs.   2.  Secondary polycythemia: - Erythrocytosis / secondary polycythemia in the setting of sleep apnea (compliant with CPAP) and testosterone supplement - He has sleep apnea and uses CPAP for the last 10 years.  He is a non-smoker. - He is on testosterone supplements for the last 2 to 3 years - he stopped from July 2022 through December 2022, with subsequent normalization of his blood counts.  He restarted testosterone on 12/25/2020. - JAK2 V617F, CALR, and MPL testing negative.   - Erythropoietin mildly elevated  at 49.8 (September 2021), likely secondary to testosterone supplement (normal-appearing kidneys on abdominal ultrasound 04/25/19 with no evidence of cysts or  benign tumors liver kidneys) - He remains on testosterone supplement, but is contemplating discontinuing it - He received phlebotomy once in the past 6 months (07/31/2021)  - He is asymptomatic, no vasomotor symptoms or blood clots  - Most recent labs (11/11/2021): Hemoglobin 18.3/hematocrit 53.4, otherwise normal CBC. - Discussed with patient that indications for phlebotomy in cases of secondary polycythemia would be severe symptoms such as strokelike symptoms, severe recurrent headaches, severe fatigue, or if hematocrit is greater than 54. - PLAN:  Recommend that he donate blood once every 3 months to help keep his blood at a healthy level (goal HCT < 54.0) - Recommend that he take aspirin 81 mg daily to decrease the risk of MI, CVA, DVT/PE in the setting of secondary polycythemia and 1 additional cardiac risk factors (hyperlipidemia) - Labs in 6 months with office visit 1 week after - Patient instructed that if he decides to stop taking testosterone, he should inform our office so that we can adjust plan accordingly   3.  Heterozygous for H63D - Noted to be heterozygote for H63D hemochromatosis gene on 03/20/2020, which is a carrier state - Heterozygous state expected to show mildly increased iron, but not expected to show signs of organ damage secondary to iron overload - Iron panel (11/11/2021): Ferritin 61, iron saturation 76% - Patient advised that he is at slightly increased risk of iron overload due to his fatty liver disease and heterozygosity for H63D hemochromatosis mutation.  Advised to avoid oral iron supplementation, alcohol consumption, cooking in cast iron skillets, or iron-rich diet.  Patient could also consider home water filtration system due to concerns regarding high iron content and local water. - PLAN: No indication for treatment at this time.  If patient develops ferritin > 800 - 1,000 we would consider MRI abdomen to rule out iron deposition in the liver.  Repeat iron panel in  6 months.   4.  Hepatic steatosis/fatty liver disease - Noted on abdominal ultrasound 04/25/2019 to have fatty liver infiltration, no splenomegaly - Follows with GI specialists - Counseled patient to cut back on alcohol intake (he drinks 1-2 beers per day), as he is at increased risk for further liver disease given his moderate alcohol intake and his status of heterozygosity for H63D (hemochromatosis carrier)  - PLAN: Patient encouraged to stop all alcohol consumption.   5.  Vitamin B12 deficiency  - Takes 1000 mcg vitamin B12 supplements every day  - Vitamin B12 (04/22/2021): Normal at 883, with normal methylmalonic acid - PLAN: Continue vitamin B12 supplement.  We will repeat B12/methylmalonic acid in 1 year (April 2024)   6.  Vitamin D deficiency  - Takes daily vitamin D supplement 5,000 units daily (decreased at last appointment due to elevated vitamin D) - Most recent vitamin D (04/22/2021): Normal at 52.74 - PLAN: Continue vitamin D 5,000 units daily (or 10,000 every other day).  We will recheck vitamin D in 1 year (April 2024)     PLAN SUMMARY: >> Blood donation (via [employer]) once every 3 months >> Labs in 6 months (CBC/D, CMP, ferritin, iron/TIBC, B12, MMA, vitamin D) >> Office visit 1 week after labs    I discussed the assessment and treatment plan with the patient. The patient was provided an opportunity to ask questions and all were answered. The patient agreed with the plan and demonstrated an understanding of  the instructions.   The patient was advised to call back or seek an in-person evaluation if the symptoms worsen or if the condition fails to improve as anticipated.  I provided 22 minutes of non-face-to-face time during this encounter.   Harriett Rush, PA-C 12/07/2021 4:16 PM

## 2021-12-07 ENCOUNTER — Encounter: Payer: Self-pay | Admitting: Physician Assistant

## 2021-12-07 ENCOUNTER — Inpatient Hospital Stay: Payer: Medicare HMO | Attending: Physician Assistant | Admitting: Physician Assistant

## 2021-12-07 VITALS — Wt 195.0 lb

## 2021-12-07 DIAGNOSIS — E559 Vitamin D deficiency, unspecified: Secondary | ICD-10-CM

## 2021-12-07 DIAGNOSIS — E538 Deficiency of other specified B group vitamins: Secondary | ICD-10-CM

## 2021-12-07 DIAGNOSIS — D751 Secondary polycythemia: Secondary | ICD-10-CM | POA: Diagnosis not present

## 2021-12-08 ENCOUNTER — Other Ambulatory Visit: Payer: Self-pay

## 2021-12-08 DIAGNOSIS — E538 Deficiency of other specified B group vitamins: Secondary | ICD-10-CM

## 2021-12-08 DIAGNOSIS — D751 Secondary polycythemia: Secondary | ICD-10-CM

## 2021-12-08 DIAGNOSIS — D696 Thrombocytopenia, unspecified: Secondary | ICD-10-CM

## 2021-12-08 DIAGNOSIS — E559 Vitamin D deficiency, unspecified: Secondary | ICD-10-CM

## 2022-03-18 ENCOUNTER — Encounter: Payer: Self-pay | Admitting: Radiology

## 2022-06-07 ENCOUNTER — Other Ambulatory Visit: Payer: Medicare HMO

## 2022-06-15 ENCOUNTER — Ambulatory Visit: Payer: Medicare HMO | Admitting: Physician Assistant

## 2022-08-07 IMAGING — CT CT ABD-PEL WO/W CM
4 of 12 series · 12 of 46 positions shown, 18 images · IV contrast (Omnipaque or Isovue)
Comparison: Ultrasound April 25, 2019

CLINICAL DATA: Gross hematuria

EXAM:
CT ABDOMEN AND PELVIS WITHOUT AND WITH CONTRAST
TECHNIQUE: Multidetector CT imaging of the abdomen and pelvis was performed
following the standard protocol before and following the bolus
administration of intravenous contrast.

[Series 4: axial pre · axial · non-contrast · 0.84mm/px · z∈[+936,+1236]mm · 4 of 100 slices shown]
[im 20/100  soft-tissue]
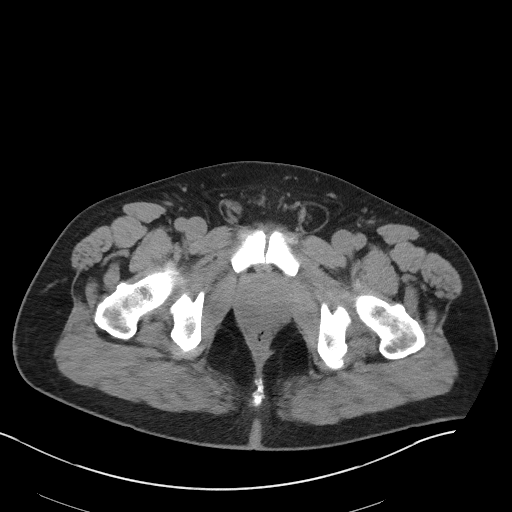
[im 40/100  soft-tissue]
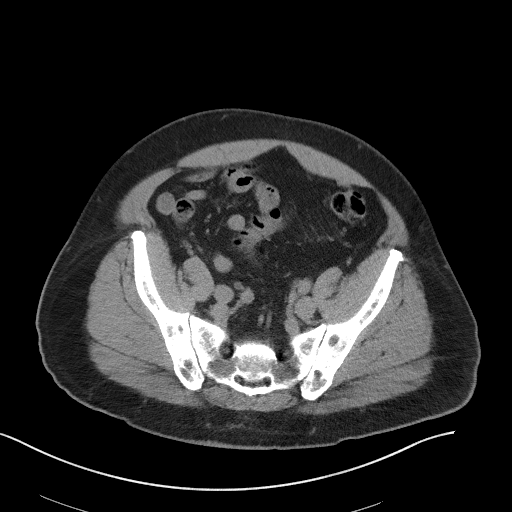
[im 60/100  soft-tissue]
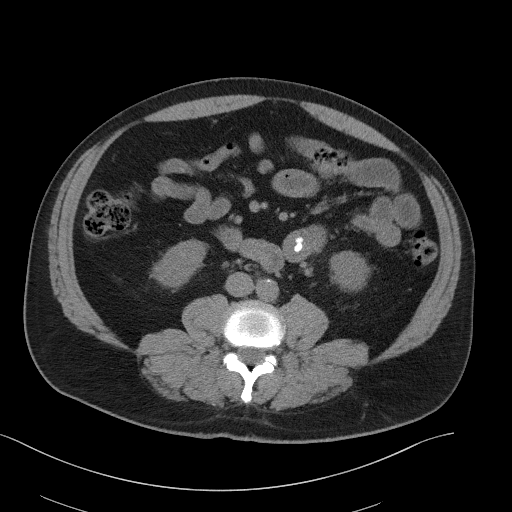
[im 80/100  soft-tissue]
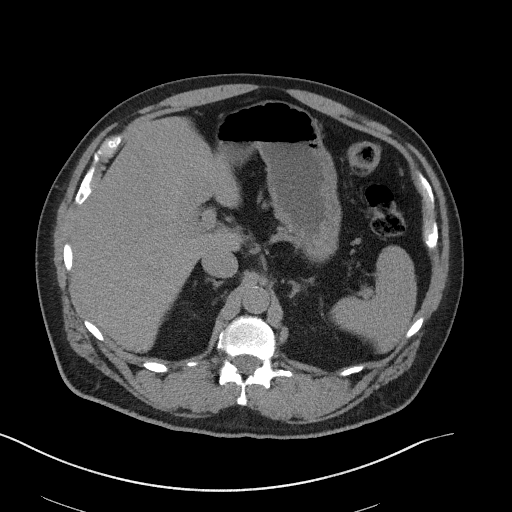

[Series 9: axial post · axial · 0.84mm/px · z∈[+926,+1231]mm · 4 of 103 slices shown, 9 images]
[im 21/103  soft-tissue]
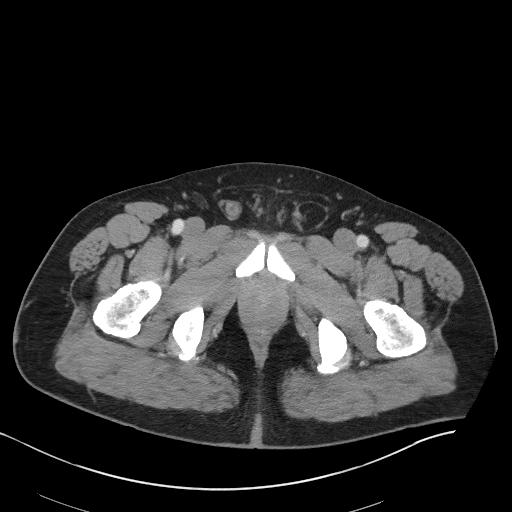
[im 21/103  lung]
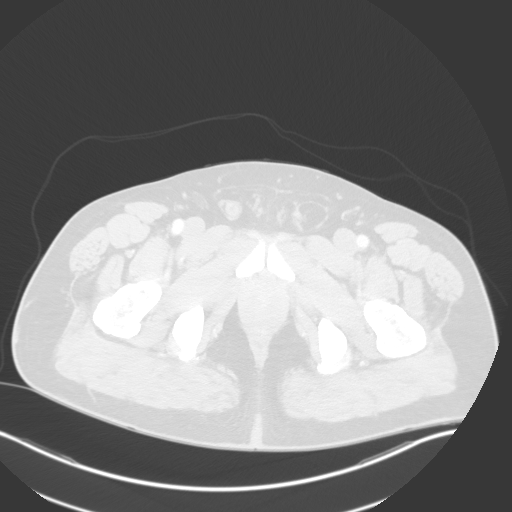
[im 21/103  bone]
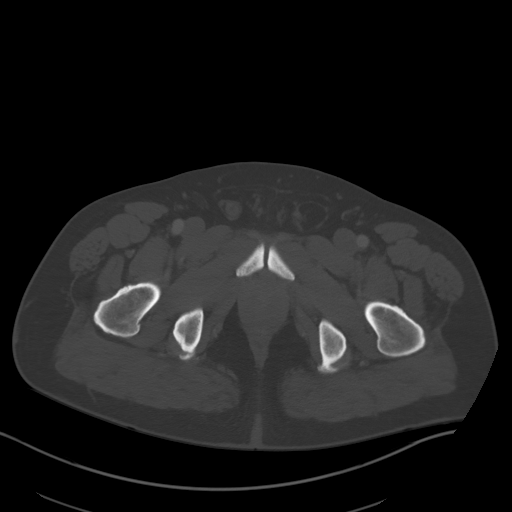
[im 41/103  soft-tissue]
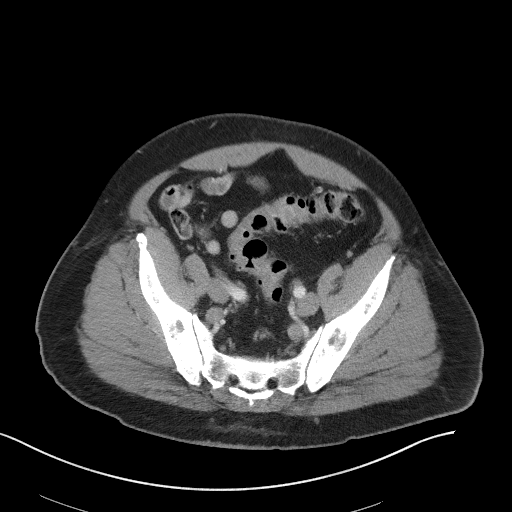
[im 41/103  lung]
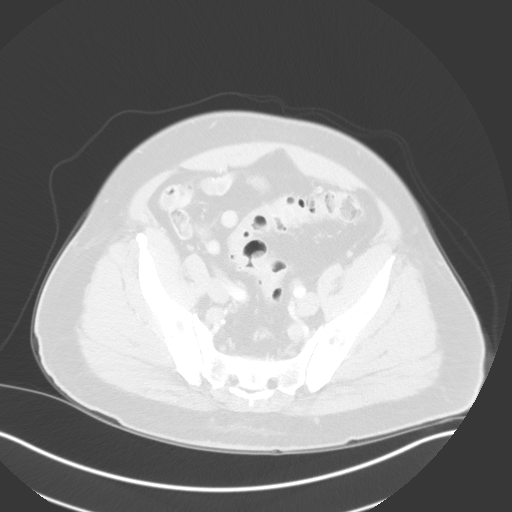
[im 62/103  soft-tissue]
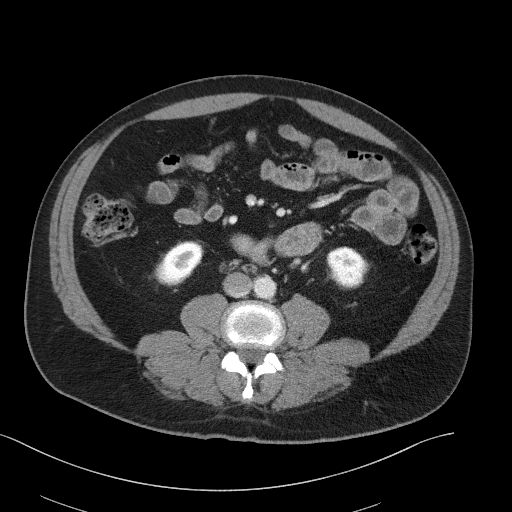
[im 62/103  lung]
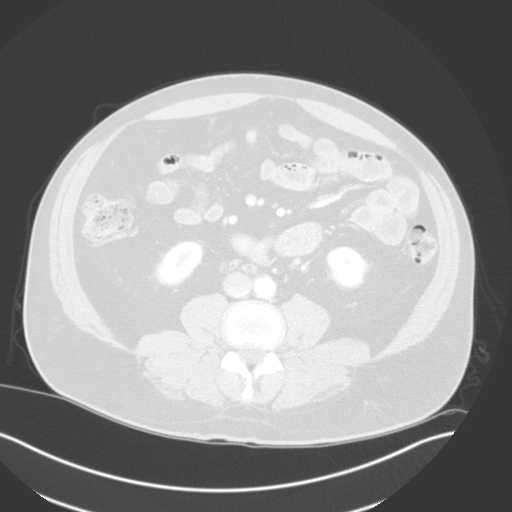
[im 82/103  soft-tissue]
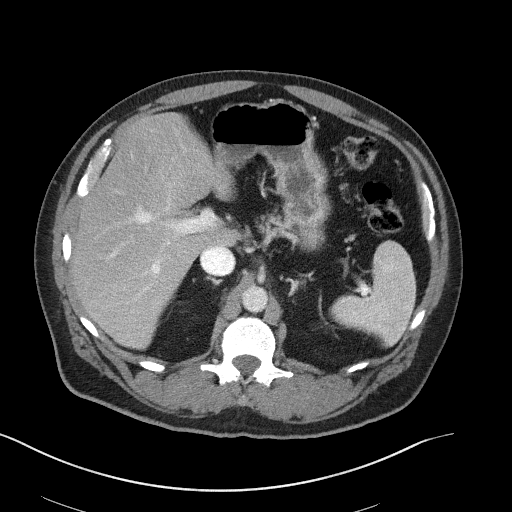
[im 82/103  lung]
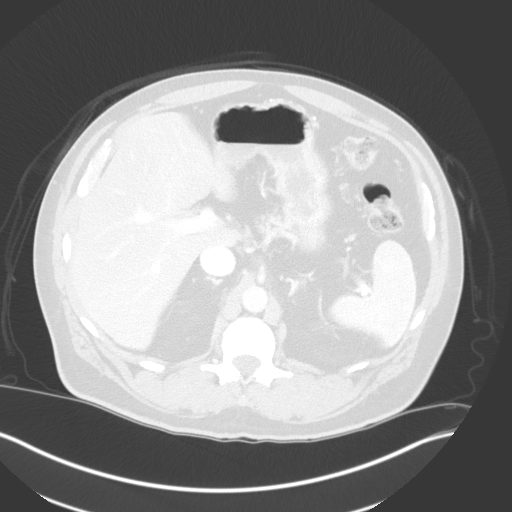

[Series 14: axial delay · axial · delayed · 0.85mm/px · z∈[+1010,+1105]mm · 2 of 98 slices shown]
[im 20/98  soft-tissue]
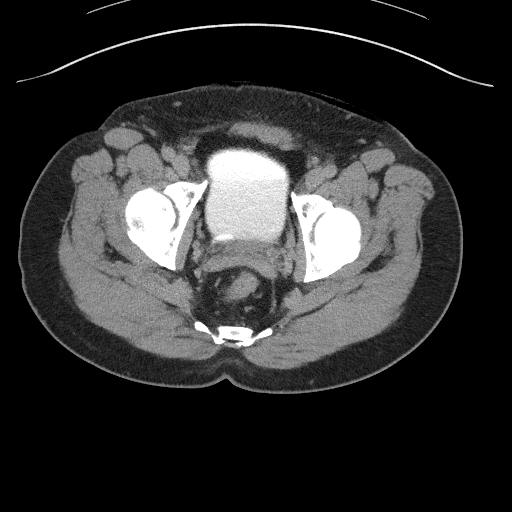
[im 39/98  soft-tissue]
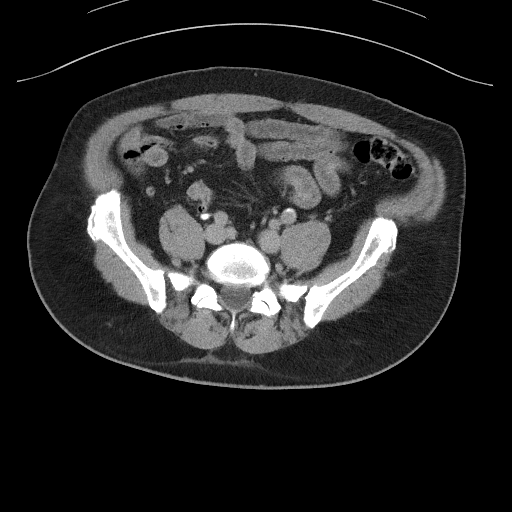

[Series 17: coronal delay · coronal · delayed · 0.86mm/px · 2 of 109 slices shown, 3 images]
[im 37/109  soft-tissue]
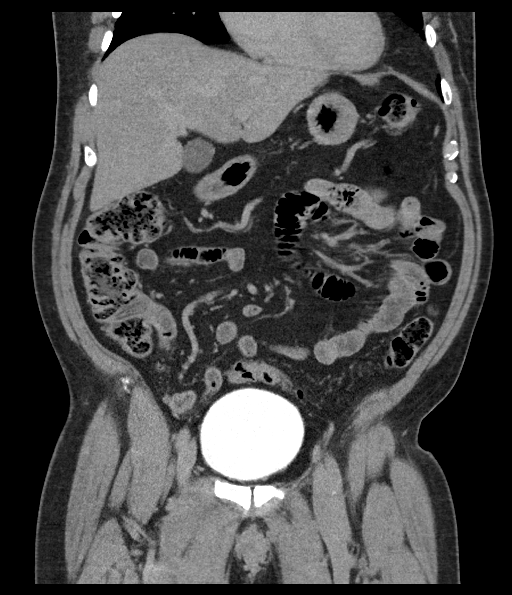
[im 37/109  bone]
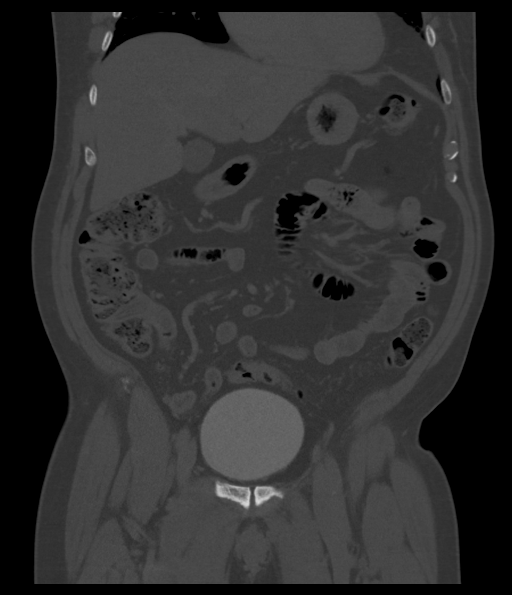
[im 73/109  soft-tissue]
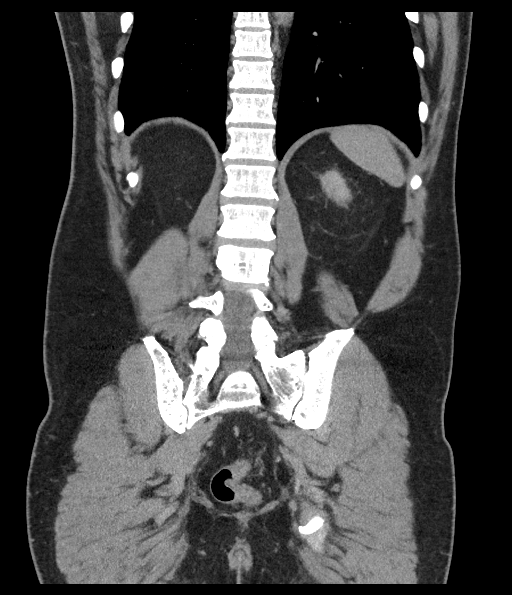

[12 of 46 positions shown; findings below may reference images not displayed]

RADIATION DOSE REDUCTION: This exam was performed according to the
departmental dose-optimization program which includes automated
exposure control, adjustment of the mA and/or kV according to
patient size and/or use of iterative reconstruction technique.

CONTRAST:  100mL OMNIPAQUE IOHEXOL 300 MG/ML  SOLN
FINDINGS: Lower chest: Bilateral subpleural reticulations.

Hepatobiliary: No suspicious hepatic lesion. Gallbladder is
unremarkable. No biliary ductal dilation.

Pancreas: No pancreatic ductal dilation or evidence of acute
inflammation.

Spleen: Normal in size without focal abnormality.

Adrenals/Urinary Tract: Bilateral adrenal glands appear normal.

No hydronephrosis. No renal, ureteral or bladder calculi identified.

No solid enhancing renal mass.

Kidneys demonstrate symmetric enhancement and excretion of contrast
material. No collecting system duplication. No suspicious filling
defect visualized within the opacified portions of the collecting
system or ureters on delayed imaging.

Urinary bladder is without abnormal wall thickening or suspicious
intraluminal filling defect.

Stomach/Bowel: No enteric contrast was administered. Stomach is
unremarkable for degree of distension. No pathologic dilation of
small or large bowel. The appendix and terminal ileum appear normal.
Colonic diverticulosis without findings of acute diverticulitis.

Vascular/Lymphatic: Scattered aortic atherosclerosis without
abdominal aortic aneurysm. No pathologically enlarged abdominal or
pelvic lymph nodes.

Reproductive: Enlargement of prostate gland measuring 6.9 x 5.3 x
5.1 cm (volume = 98 cm^3).

Other: No significant abdominopelvic free fluid.

Musculoskeletal: Thoracolumbar spondylosis. No acute osseous
abnormality.
IMPRESSION: 1. No hydronephrosis.  No renal, ureteral or bladder calculi.
2. No solid enhancing renal mass.
3. Prostatomegaly with a volume of 98 cc.
4. Colonic diverticulosis without findings of acute diverticulitis.
5.  Aortic Atherosclerosis (SACM1-KG1.1).
